# Patient Record
Sex: Female | Born: 1975 | Race: White | Hispanic: No | Marital: Married | Smoking: Never smoker
Health system: Southern US, Community
[De-identification: ages and names within clinical notes are randomized; demographics above are authoritative.]

## PROBLEM LIST (undated history)

## (undated) DIAGNOSIS — F32A Depression, unspecified: Secondary | ICD-10-CM

## (undated) DIAGNOSIS — F419 Anxiety disorder, unspecified: Secondary | ICD-10-CM

## (undated) DIAGNOSIS — F329 Major depressive disorder, single episode, unspecified: Secondary | ICD-10-CM

## (undated) HISTORY — PX: BREAST BIOPSY: SHX20

## (undated) HISTORY — PX: OTHER SURGICAL HISTORY: SHX169

## (undated) HISTORY — PX: WISDOM TOOTH EXTRACTION: SHX21

---

## 2004-04-29 ENCOUNTER — Other Ambulatory Visit: Admission: RE | Admit: 2004-04-29 | Discharge: 2004-04-29 | Payer: Self-pay | Admitting: Family Medicine

## 2005-05-25 ENCOUNTER — Other Ambulatory Visit: Admission: RE | Admit: 2005-05-25 | Discharge: 2005-05-25 | Payer: Self-pay | Admitting: Family Medicine

## 2006-06-24 ENCOUNTER — Other Ambulatory Visit: Admission: RE | Admit: 2006-06-24 | Discharge: 2006-06-24 | Payer: Self-pay | Admitting: Family Medicine

## 2007-06-17 ENCOUNTER — Other Ambulatory Visit: Admission: RE | Admit: 2007-06-17 | Discharge: 2007-06-17 | Payer: Self-pay | Admitting: Family Medicine

## 2007-07-01 ENCOUNTER — Other Ambulatory Visit: Admission: RE | Admit: 2007-07-01 | Discharge: 2007-07-01 | Payer: Self-pay | Admitting: Otolaryngology

## 2008-06-18 ENCOUNTER — Other Ambulatory Visit: Admission: RE | Admit: 2008-06-18 | Discharge: 2008-06-18 | Payer: Self-pay | Admitting: Family Medicine

## 2009-02-07 ENCOUNTER — Inpatient Hospital Stay (HOSPITAL_COMMUNITY): Admission: AD | Admit: 2009-02-07 | Discharge: 2009-02-07 | Payer: Self-pay | Admitting: Obstetrics and Gynecology

## 2010-02-07 ENCOUNTER — Inpatient Hospital Stay (HOSPITAL_COMMUNITY): Admission: AD | Admit: 2010-02-07 | Discharge: 2010-02-14 | Payer: Self-pay | Admitting: Obstetrics and Gynecology

## 2010-02-10 ENCOUNTER — Encounter (INDEPENDENT_AMBULATORY_CARE_PROVIDER_SITE_OTHER): Payer: Self-pay | Admitting: Obstetrics and Gynecology

## 2010-12-07 ENCOUNTER — Encounter: Payer: Self-pay | Admitting: Obstetrics and Gynecology

## 2011-02-09 LAB — COMPREHENSIVE METABOLIC PANEL
ALT: 185 U/L — ABNORMAL HIGH (ref 0–35)
ALT: 35 U/L (ref 0–35)
AST: 171 U/L — ABNORMAL HIGH (ref 0–37)
AST: 41 U/L — ABNORMAL HIGH (ref 0–37)
AST: 42 U/L — ABNORMAL HIGH (ref 0–37)
Albumin: 2 g/dL — ABNORMAL LOW (ref 3.5–5.2)
Albumin: 2.4 g/dL — ABNORMAL LOW (ref 3.5–5.2)
Albumin: 2.8 g/dL — ABNORMAL LOW (ref 3.5–5.2)
Alkaline Phosphatase: 102 U/L (ref 39–117)
Alkaline Phosphatase: 96 U/L (ref 39–117)
Alkaline Phosphatase: 99 U/L (ref 39–117)
BUN: 10 mg/dL (ref 6–23)
BUN: 10 mg/dL (ref 6–23)
BUN: 11 mg/dL (ref 6–23)
BUN: 11 mg/dL (ref 6–23)
BUN: 13 mg/dL (ref 6–23)
CO2: 28 mEq/L (ref 19–32)
Calcium: 6.6 mg/dL — ABNORMAL LOW (ref 8.4–10.5)
Calcium: 6.7 mg/dL — ABNORMAL LOW (ref 8.4–10.5)
Calcium: 7.7 mg/dL — ABNORMAL LOW (ref 8.4–10.5)
Calcium: 9.4 mg/dL (ref 8.4–10.5)
Chloride: 100 mEq/L (ref 96–112)
Chloride: 105 mEq/L (ref 96–112)
Chloride: 105 mEq/L (ref 96–112)
Chloride: 106 mEq/L (ref 96–112)
Creatinine, Ser: 0.71 mg/dL (ref 0.4–1.2)
Creatinine, Ser: 0.78 mg/dL (ref 0.4–1.2)
Creatinine, Ser: 0.83 mg/dL (ref 0.4–1.2)
Creatinine, Ser: 0.84 mg/dL (ref 0.4–1.2)
GFR calc Af Amer: >60 mL/min (ref >60)
GFR calc non Af Amer: >60 mL/min (ref >60)
GFR calc non Af Amer: >60 mL/min (ref >60)
GFR calc non Af Amer: >60 mL/min (ref >60)
GFR calc non Af Amer: >60 mL/min (ref >60)
Glucose, Bld: 128 mg/dL — ABNORMAL HIGH (ref 70–99)
Glucose, Bld: 128 mg/dL — ABNORMAL HIGH (ref 70–99)
Glucose, Bld: 85 mg/dL (ref 70–99)
Glucose, Bld: 91 mg/dL (ref 70–99)
Glucose, Bld: 98 mg/dL (ref 70–99)
Potassium: 4 mEq/L (ref 3.5–5.1)
Potassium: 4.1 mEq/L (ref 3.5–5.1)
Sodium: 132 mEq/L — ABNORMAL LOW (ref 135–145)
Sodium: 135 mEq/L (ref 135–145)
Sodium: 136 mEq/L (ref 135–145)
Sodium: 136 mEq/L (ref 135–145)
Total Bilirubin: 0.3 mg/dL (ref 0.3–1.2)
Total Bilirubin: 0.4 mg/dL (ref 0.3–1.2)
Total Bilirubin: 0.5 mg/dL (ref 0.3–1.2)
Total Bilirubin: 0.6 mg/dL (ref 0.3–1.2)
Total Protein: 4.6 g/dL — ABNORMAL LOW (ref 6.0–8.3)
Total Protein: 4.9 g/dL — ABNORMAL LOW (ref 6.0–8.3)

## 2011-02-09 LAB — CBC
HCT: 31.3 % — ABNORMAL LOW (ref 36.0–46.0)
HCT: 31.4 % — ABNORMAL LOW (ref 36.0–46.0)
HCT: 34.7 % — ABNORMAL LOW (ref 36.0–46.0)
Hemoglobin: 10.4 g/dL — ABNORMAL LOW (ref 12.0–15.0)
Hemoglobin: 10.6 g/dL — ABNORMAL LOW (ref 12.0–15.0)
Hemoglobin: 10.6 g/dL — ABNORMAL LOW (ref 12.0–15.0)
MCHC: 33.7 g/dL (ref 30.0–36.0)
MCHC: 33.8 g/dL (ref 30.0–36.0)
MCHC: 34.1 g/dL (ref 30.0–36.0)
MCV: 95.5 fL (ref 78.0–100.0)
MCV: 96.6 fL (ref 78.0–100.0)
MCV: 96.6 fL (ref 78.0–100.0)
Platelets: 105 10*3/uL — ABNORMAL LOW (ref 150–400)
Platelets: 250 10*3/uL (ref 150–400)
RBC: 3.24 MIL/uL — ABNORMAL LOW (ref 3.87–5.11)
RBC: 3.28 MIL/uL — ABNORMAL LOW (ref 3.87–5.11)
RBC: 3.7 MIL/uL — ABNORMAL LOW (ref 3.87–5.11)
RDW: 13.4 % (ref 11.5–15.5)
WBC: 13.5 10*3/uL — ABNORMAL HIGH (ref 4.0–10.5)
WBC: 16.3 10*3/uL — ABNORMAL HIGH (ref 4.0–10.5)
WBC: 17.5 10*3/uL — ABNORMAL HIGH (ref 4.0–10.5)
WBC: 18.3 10*3/uL — ABNORMAL HIGH (ref 4.0–10.5)
WBC: 20.5 10*3/uL — ABNORMAL HIGH (ref 4.0–10.5)

## 2011-02-09 LAB — URIC ACID
Uric Acid, Serum: 4.8 mg/dL (ref 2.4–7.0)
Uric Acid, Serum: 5.3 mg/dL (ref 2.4–7.0)
Uric Acid, Serum: 6 mg/dL (ref 2.4–7.0)

## 2011-02-09 LAB — RPR: RPR Ser Ql: NONREACTIVE

## 2011-02-26 LAB — CBC
MCHC: 33.9 g/dL (ref 30.0–36.0)
RBC: 3.82 MIL/uL — ABNORMAL LOW (ref 3.87–5.11)
WBC: 10.5 10*3/uL (ref 4.0–10.5)

## 2011-02-26 LAB — HCG, QUANTITATIVE, PREGNANCY: hCG, Beta Chain, Quant, S: 2535 m[IU]/mL — ABNORMAL HIGH (ref <5)

## 2011-02-26 LAB — ABO/RH: ABO/RH(D): O POS

## 2011-09-28 ENCOUNTER — Other Ambulatory Visit: Payer: Self-pay | Admitting: Gynecology

## 2011-11-30 ENCOUNTER — Other Ambulatory Visit: Payer: Self-pay | Admitting: Gynecology

## 2012-10-03 ENCOUNTER — Encounter (HOSPITAL_COMMUNITY): Payer: Self-pay | Admitting: Pharmacist

## 2012-10-10 ENCOUNTER — Encounter (HOSPITAL_COMMUNITY): Payer: Self-pay

## 2012-10-11 ENCOUNTER — Encounter (HOSPITAL_COMMUNITY): Payer: Self-pay

## 2012-10-11 ENCOUNTER — Encounter (HOSPITAL_COMMUNITY)
Admission: RE | Admit: 2012-10-11 | Discharge: 2012-10-11 | Disposition: A | Discharge status: Home / Self Care | Payer: BC Managed Care – PPO | Source: Ambulatory Visit | Attending: Obstetrics and Gynecology | Admitting: Obstetrics and Gynecology

## 2012-10-11 HISTORY — DX: Anxiety disorder, unspecified: F41.9

## 2012-10-11 HISTORY — DX: Major depressive disorder, single episode, unspecified: F32.9

## 2012-10-11 HISTORY — DX: Depression, unspecified: F32.A

## 2012-10-11 LAB — SURGICAL PCR SCREEN
MRSA, PCR: NEGATIVE
Staphylococcus aureus: NEGATIVE

## 2012-10-11 LAB — CBC
MCV: 89.8 fL (ref 78.0–100.0)
Platelets: 141 10*3/uL — ABNORMAL LOW (ref 150–400)
RDW: 13.3 % (ref 11.5–15.5)
WBC: 11.3 10*3/uL — ABNORMAL HIGH (ref 4.0–10.5)

## 2012-10-11 LAB — TYPE AND SCREEN: ABO/RH(D): O POS

## 2012-10-11 NOTE — Patient Instructions (Addendum)
   Your procedure is scheduled on: Monday, Dec 2  Enter through the [employer] [facility] at: 745am Pick up the phone at the desk and dial [phone] and inform us of your arrival.  Please call this number if you have any problems the morning of surgery: [phone]  Remember: Do not eat food after midnight: Sunday Do not drink clear liquids after: Sunday Take these medicines the morning of surgery with a SIP OF WATER: zoloft  Do not wear jewelry, make-up, or FINGER nail polish No metal in your hair or on your body. Do not wear lotions, powders, perfumes. You may wear deodorant.  Please use your CHG wash as directed prior to surgery.  Do not shave anywhere for at least 12 hours prior to first CHG shower.  Do not bring valuables to the hospital. Contacts, dentures or bridgework may not be worn into surgery.  Leave suitcase in the car. After Surgery it may be brought to your room. For patients being admitted to the hospital, checkout time is 11:00am the day of discharge.  Home with husband Caryn Bee .

## 2012-10-16 NOTE — H&P (Signed)
Kathryn Rowland is a 36 y.o. 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  This pregnancy has been uneventful.  The patient does have a h/o anxiety and depression which are stable on zoloft.  She desires permanent sterility with her c-section.  Maternal Medical History:  Fetal activity: Perceived fetal activity is normal.      OB History    Grav Para Term Preterm Abortions TAB SAB Ect Mult Living   3 1  1 1  1   1     2011 C-section  Mid fundal transverse Female 30 weeks 2#14oz SAB x 1  Past Medical History  Diagnosis Date  . Depression   . Anxiety    Past Surgical History  Procedure Date  . Cesarean section 01/2010    30 wks  . Wisdom tooth extraction   . Cyst removed     right side of neck  . Index finger surgery     as an infant - right index   Family History: family history is not on file. Social History:  reports that she has never smoked. She has never used smokeless tobacco. She reports that she does not drink alcohol or use illicit drugs.   Prenatal Transfer Tool  Maternal Diabetes: No Genetic Screening: Normal Maternal Ultrasounds/Referrals: Normal Fetal Ultrasounds or other Referrals:  None Maternal Substance Abuse:  No Significant Maternal Medications:  Meds include: Zoloft Significant Maternal Lab Results:  None Other Comments:  None  ROS    Height 5\' 4"  (1.626 m), weight 85.276 kg (188 lb), last menstrual period 01/15/2012. Maternal Exam:  Uterine Assessment: Contraction strength is mild.  Contraction frequency is rare.   Abdomen: Patient reports no abdominal tenderness. Fetal presentation: vertex  Introitus: Normal vulva. Normal vagina.    Physical Exam  Constitutional: She is oriented to person, place, and time. She appears well-developed and well-nourished.  Cardiovascular: Normal rate and  regular rhythm.   Respiratory: Effort normal and breath sounds normal.  GI: Soft.  Genitourinary: Vagina normal.  Neurological: She is alert and oriented to person, place, and time.  Psychiatric: She has a normal mood and affect.    Prenatal labs: ABO, Rh: --/--/O POS (11/26 1541) Antibody: NEG (11/26 1541) Rubella:  Immune RPR: NON REACTIVE (11/26 1541)  HBsAg:   Neg HIV:   Neg GBS:   Neg First trimester screen and AFP WNL One hour GCT 176 3 hour GTT WNL CF Screen neg  Assessment/Plan: Pt counseled for repeat C-section and TOL not recommended due to h/o higher transverse fundal incision.  Risks of bleeding , infection and possible damage to bowel and bladder d/w her.  She also expresses a wish for permanent sterility.  We discussed BTL in detail including 1/100 risk of failure in a traditional tubal.  We also discussed recent literature suggesting that distal salpingectomy may reduce the risk of ovarian cancer.  She would like to proceed with a full distal salpingectomy.    Oliver Pila 10/16/2012, 10:16 PM

## 2012-10-17 ENCOUNTER — Encounter (HOSPITAL_COMMUNITY): Payer: Self-pay | Admitting: Anesthesiology

## 2012-10-17 ENCOUNTER — Inpatient Hospital Stay (HOSPITAL_COMMUNITY)
Admission: AD | Admit: 2012-10-17 | Payer: BC Managed Care – PPO | Source: Ambulatory Visit | Admitting: Obstetrics and Gynecology

## 2012-10-17 ENCOUNTER — Encounter (HOSPITAL_COMMUNITY)
Admission: RE | Disposition: A | Discharge status: Home / Self Care | Payer: Self-pay | Source: Ambulatory Visit | Attending: Obstetrics and Gynecology

## 2012-10-17 ENCOUNTER — Inpatient Hospital Stay (HOSPITAL_COMMUNITY): Payer: BC Managed Care – PPO | Admitting: Anesthesiology

## 2012-10-17 ENCOUNTER — Encounter (HOSPITAL_COMMUNITY): Payer: Self-pay | Admitting: *Deleted

## 2012-10-17 ENCOUNTER — Inpatient Hospital Stay (HOSPITAL_COMMUNITY)
Admission: RE | Admit: 2012-10-17 | Discharge: 2012-10-19 | DRG: 371 | Disposition: A | Discharge status: Home / Self Care | Payer: BC Managed Care – PPO | Source: Ambulatory Visit | Attending: Obstetrics and Gynecology | Admitting: Obstetrics and Gynecology

## 2012-10-17 DIAGNOSIS — O09529 Supervision of elderly multigravida, unspecified trimester: Secondary | ICD-10-CM | POA: Diagnosis present

## 2012-10-17 DIAGNOSIS — Z98891 History of uterine scar from previous surgery: Secondary | ICD-10-CM

## 2012-10-17 DIAGNOSIS — Z302 Encounter for sterilization: Secondary | ICD-10-CM

## 2012-10-17 DIAGNOSIS — O34219 Maternal care for unspecified type scar from previous cesarean delivery: Principal | ICD-10-CM | POA: Diagnosis present

## 2012-10-17 LAB — TYPE AND SCREEN

## 2012-10-17 SURGERY — Surgical Case
Anesthesia: Spinal | Site: Abdomen | Laterality: Bilateral | Wound class: Clean Contaminated

## 2012-10-17 MED ORDER — SERTRALINE HCL 100 MG PO TABS
100.0000 mg | ORAL_TABLET | Freq: Every day | ORAL | Status: DC
  Administered 2012-10-18: 100 mg via ORAL
  Filled 2012-10-17 (×4): qty 1

## 2012-10-17 MED ORDER — NALOXONE HCL 0.4 MG/ML IJ SOLN: 0.4000 mg | INTRAMUSCULAR | Status: DC | PRN

## 2012-10-17 MED ORDER — KETOROLAC TROMETHAMINE 30 MG/ML IJ SOLN: 30.0000 mg | Freq: Four times a day (QID) | INTRAMUSCULAR | Status: AC | PRN

## 2012-10-17 MED ORDER — IBUPROFEN 600 MG PO TABS
600.0000 mg | ORAL_TABLET | Freq: Four times a day (QID) | ORAL | Status: DC | PRN
  Filled 2012-10-17 (×2): qty 1

## 2012-10-17 MED ORDER — PHENYLEPHRINE 40 MCG/ML (10ML) SYRINGE FOR IV PUSH (FOR BLOOD PRESSURE SUPPORT)
PREFILLED_SYRINGE | INTRAVENOUS | Status: AC
  Filled 2012-10-17: qty 5

## 2012-10-17 MED ORDER — OXYTOCIN 10 UNIT/ML IJ SOLN
INTRAMUSCULAR | Status: AC
  Filled 2012-10-17: qty 4

## 2012-10-17 MED ORDER — METOCLOPRAMIDE HCL 5 MG/ML IJ SOLN: 10.0000 mg | Freq: Three times a day (TID) | INTRAMUSCULAR | Status: DC | PRN

## 2012-10-17 MED ORDER — NALOXONE HCL 1 MG/ML IJ SOLN
1.0000 ug/kg/h | INTRAVENOUS | Status: DC | PRN
  Filled 2012-10-17: qty 2

## 2012-10-17 MED ORDER — DIPHENHYDRAMINE HCL 50 MG/ML IJ SOLN
12.5000 mg | INTRAMUSCULAR | Status: DC | PRN
  Administered 2012-10-17: 14:00:00 via INTRAVENOUS
  Filled 2012-10-17: qty 1

## 2012-10-17 MED ORDER — OXYCODONE-ACETAMINOPHEN 5-325 MG PO TABS
1.0000 | ORAL_TABLET | ORAL | Status: DC | PRN
  Administered 2012-10-18 (×3): 1 via ORAL
  Filled 2012-10-17 (×3): qty 1

## 2012-10-17 MED ORDER — KETOROLAC TROMETHAMINE 60 MG/2ML IM SOLN
60.0000 mg | Freq: Once | INTRAMUSCULAR | Status: AC | PRN
  Administered 2012-10-17: 60 mg via INTRAMUSCULAR

## 2012-10-17 MED ORDER — EPHEDRINE SULFATE 50 MG/ML IJ SOLN
INTRAMUSCULAR | Status: DC | PRN
  Administered 2012-10-17: 5 mg via INTRAVENOUS

## 2012-10-17 MED ORDER — EPHEDRINE 5 MG/ML INJ
INTRAVENOUS | Status: AC
  Filled 2012-10-17: qty 10

## 2012-10-17 MED ORDER — ONDANSETRON HCL 4 MG PO TABS: 4.0000 mg | ORAL_TABLET | ORAL | Status: DC | PRN

## 2012-10-17 MED ORDER — LANOLIN HYDROUS EX OINT: 1.0000 "application " | TOPICAL_OINTMENT | CUTANEOUS | Status: DC | PRN

## 2012-10-17 MED ORDER — FENTANYL CITRATE 0.05 MG/ML IJ SOLN
INTRAMUSCULAR | Status: AC
  Filled 2012-10-17: qty 2

## 2012-10-17 MED ORDER — LACTATED RINGERS IV SOLN
INTRAVENOUS | Status: DC
  Administered 2012-10-17 (×3): via INTRAVENOUS

## 2012-10-17 MED ORDER — FENTANYL CITRATE 0.05 MG/ML IJ SOLN
INTRAMUSCULAR | Status: DC | PRN
  Administered 2012-10-17: 25 ug via INTRATHECAL

## 2012-10-17 MED ORDER — CEFAZOLIN SODIUM-DEXTROSE 2-3 GM-% IV SOLR
INTRAVENOUS | Status: AC
  Filled 2012-10-17: qty 50

## 2012-10-17 MED ORDER — SIMETHICONE 80 MG PO CHEW
80.0000 mg | CHEWABLE_TABLET | Freq: Three times a day (TID) | ORAL | Status: DC
  Administered 2012-10-17 – 2012-10-18 (×6): 80 mg via ORAL

## 2012-10-17 MED ORDER — OXYTOCIN 10 UNIT/ML IJ SOLN
INTRAMUSCULAR | Status: DC | PRN
  Administered 2012-10-17: 40 [IU] via INTRAMUSCULAR

## 2012-10-17 MED ORDER — LACTATED RINGERS IV SOLN
INTRAVENOUS | Status: DC
Start: 2012-10-17 — End: 2012-10-17
  Administered 2012-10-17: 08:00:00 via INTRAVENOUS

## 2012-10-17 MED ORDER — ONDANSETRON HCL 4 MG/2ML IJ SOLN
INTRAMUSCULAR | Status: AC
  Filled 2012-10-17: qty 2

## 2012-10-17 MED ORDER — NALBUPHINE HCL 10 MG/ML IJ SOLN
5.0000 mg | INTRAMUSCULAR | Status: DC | PRN
  Filled 2012-10-17 (×2): qty 1

## 2012-10-17 MED ORDER — OXYTOCIN 40 UNITS IN LACTATED RINGERS INFUSION - SIMPLE MED: 62.5000 mL/h | INTRAVENOUS | Status: AC

## 2012-10-17 MED ORDER — ZOLPIDEM TARTRATE 5 MG PO TABS: 5.0000 mg | ORAL_TABLET | Freq: Every evening | ORAL | Status: DC | PRN

## 2012-10-17 MED ORDER — SCOPOLAMINE 1 MG/3DAYS TD PT72
MEDICATED_PATCH | TRANSDERMAL | Status: AC
  Administered 2012-10-17: 1.5 mg via TRANSDERMAL
  Filled 2012-10-17: qty 1

## 2012-10-17 MED ORDER — KETOROLAC TROMETHAMINE 60 MG/2ML IM SOLN
INTRAMUSCULAR | Status: AC
  Filled 2012-10-17: qty 2

## 2012-10-17 MED ORDER — SCOPOLAMINE 1 MG/3DAYS TD PT72
1.0000 | MEDICATED_PATCH | Freq: Once | TRANSDERMAL | Status: DC
  Administered 2012-10-17: 1.5 mg via TRANSDERMAL

## 2012-10-17 MED ORDER — WITCH HAZEL-GLYCERIN EX PADS: 1.0000 "application " | MEDICATED_PAD | CUTANEOUS | Status: DC | PRN

## 2012-10-17 MED ORDER — CEFAZOLIN SODIUM-DEXTROSE 2-3 GM-% IV SOLR
2.0000 g | INTRAVENOUS | Status: AC
  Administered 2012-10-17: 2 g via INTRAVENOUS

## 2012-10-17 MED ORDER — SENNOSIDES-DOCUSATE SODIUM 8.6-50 MG PO TABS
2.0000 | ORAL_TABLET | Freq: Every day | ORAL | Status: DC
  Administered 2012-10-17 – 2012-10-18 (×2): 2 via ORAL

## 2012-10-17 MED ORDER — SODIUM CHLORIDE 0.9 % IJ SOLN: 3.0000 mL | INTRAMUSCULAR | Status: DC | PRN

## 2012-10-17 MED ORDER — DIPHENHYDRAMINE HCL 25 MG PO CAPS
25.0000 mg | ORAL_CAPSULE | ORAL | Status: DC | PRN
  Filled 2012-10-17: qty 1

## 2012-10-17 MED ORDER — NALBUPHINE HCL 10 MG/ML IJ SOLN
5.0000 mg | INTRAMUSCULAR | Status: DC | PRN
  Administered 2012-10-17: 10 mg via SUBCUTANEOUS
  Filled 2012-10-17: qty 1

## 2012-10-17 MED ORDER — DIBUCAINE 1 % RE OINT: 1.0000 "application " | TOPICAL_OINTMENT | RECTAL | Status: DC | PRN

## 2012-10-17 MED ORDER — ONDANSETRON HCL 4 MG/2ML IJ SOLN
INTRAMUSCULAR | Status: DC | PRN
  Administered 2012-10-17: 4 mg via INTRAVENOUS

## 2012-10-17 MED ORDER — PHENYLEPHRINE HCL 10 MG/ML IJ SOLN
INTRAMUSCULAR | Status: DC | PRN
  Administered 2012-10-17 (×5): 40 ug via INTRAVENOUS

## 2012-10-17 MED ORDER — PRENATAL MULTIVITAMIN CH
1.0000 | ORAL_TABLET | Freq: Every day | ORAL | Status: DC
  Administered 2012-10-18 – 2012-10-19 (×2): 1 via ORAL
  Filled 2012-10-17 (×2): qty 1

## 2012-10-17 MED ORDER — MEPERIDINE HCL 25 MG/ML IJ SOLN: 6.2500 mg | INTRAMUSCULAR | Status: DC | PRN

## 2012-10-17 MED ORDER — IBUPROFEN 600 MG PO TABS
600.0000 mg | ORAL_TABLET | Freq: Four times a day (QID) | ORAL | Status: DC
  Administered 2012-10-17 – 2012-10-19 (×8): 600 mg via ORAL
  Filled 2012-10-17 (×5): qty 1

## 2012-10-17 MED ORDER — ONDANSETRON HCL 4 MG/2ML IJ SOLN: 4.0000 mg | Freq: Three times a day (TID) | INTRAMUSCULAR | Status: DC | PRN

## 2012-10-17 MED ORDER — LACTATED RINGERS IV SOLN
INTRAVENOUS | Status: DC | PRN
  Administered 2012-10-17: 10:00:00 via INTRAVENOUS

## 2012-10-17 MED ORDER — DIPHENHYDRAMINE HCL 25 MG PO CAPS: 25.0000 mg | ORAL_CAPSULE | Freq: Four times a day (QID) | ORAL | Status: DC | PRN

## 2012-10-17 MED ORDER — TETANUS-DIPHTH-ACELL PERTUSSIS 5-2.5-18.5 LF-MCG/0.5 IM SUSP: 0.5000 mL | Freq: Once | INTRAMUSCULAR | Status: DC

## 2012-10-17 MED ORDER — DIPHENHYDRAMINE HCL 50 MG/ML IJ SOLN: 25.0000 mg | INTRAMUSCULAR | Status: DC | PRN

## 2012-10-17 MED ORDER — HYDROMORPHONE HCL PF 1 MG/ML IJ SOLN: 0.2500 mg | INTRAMUSCULAR | Status: DC | PRN

## 2012-10-17 MED ORDER — SCOPOLAMINE 1 MG/3DAYS TD PT72: 1.0000 | MEDICATED_PATCH | Freq: Once | TRANSDERMAL | Status: DC

## 2012-10-17 MED ORDER — MORPHINE SULFATE (PF) 0.5 MG/ML IJ SOLN
INTRAMUSCULAR | Status: DC | PRN
  Administered 2012-10-17: .15 mg via EPIDURAL

## 2012-10-17 MED ORDER — LACTATED RINGERS IV SOLN
INTRAVENOUS | Status: DC
  Administered 2012-10-17: 18:00:00 via INTRAVENOUS

## 2012-10-17 MED ORDER — MENTHOL 3 MG MT LOZG: 1.0000 | LOZENGE | OROMUCOSAL | Status: DC | PRN

## 2012-10-17 MED ORDER — ONDANSETRON HCL 4 MG/2ML IJ SOLN: 4.0000 mg | INTRAMUSCULAR | Status: DC | PRN

## 2012-10-17 MED ORDER — SIMETHICONE 80 MG PO CHEW: 80.0000 mg | CHEWABLE_TABLET | ORAL | Status: DC | PRN

## 2012-10-17 MED ORDER — MORPHINE SULFATE 0.5 MG/ML IJ SOLN
INTRAMUSCULAR | Status: AC
  Filled 2012-10-17: qty 10

## 2012-10-17 SURGICAL SUPPLY — 28 items
APL SKNCLS STERI-STRIP NONHPOA (GAUZE/BANDAGES/DRESSINGS) ×2
BENZOIN TINCTURE PRP APPL 2/3 (GAUZE/BANDAGES/DRESSINGS) ×4 IMPLANT
CANISTER SUCTION 2500CC (MISCELLANEOUS) ×2 IMPLANT
CLOTH BEACON ORANGE TIMEOUT ST (SAFETY) ×2 IMPLANT
CONTAINER PREFILL 10% NBF 15ML (MISCELLANEOUS) ×4 IMPLANT
DRSG COVADERM 4X10 (GAUZE/BANDAGES/DRESSINGS) ×2 IMPLANT
DURAPREP 26ML APPLICATOR (WOUND CARE) ×2 IMPLANT
ELECT REM PT RETURN 9FT ADLT (ELECTROSURGICAL) ×2
ELECTRODE REM PT RTRN 9FT ADLT (ELECTROSURGICAL) ×1 IMPLANT
GLOVE BIO SURGEON STRL SZ 6.5 (GLOVE) ×4 IMPLANT
GOWN PREVENTION PLUS LG XLONG (DISPOSABLE) ×4 IMPLANT
NS IRRIG 1000ML POUR BTL (IV SOLUTION) ×2 IMPLANT
PACK C SECTION WH (CUSTOM PROCEDURE TRAY) ×2 IMPLANT
PAD OB MATERNITY 4.3X12.25 (PERSONAL CARE ITEMS) ×2 IMPLANT
RTRCTR C-SECT PINK 25CM LRG (MISCELLANEOUS) ×2 IMPLANT
STRIP CLOSURE SKIN 1/2X4 (GAUZE/BANDAGES/DRESSINGS) ×4 IMPLANT
SUT CHROMIC 1 CTX 36 (SUTURE) ×6 IMPLANT
SUT PLAIN 0 NONE (SUTURE) ×2 IMPLANT
SUT PLAIN 2 0 (SUTURE) ×2
SUT PLAIN ABS 2-0 CT1 27XMFL (SUTURE) ×1 IMPLANT
SUT VIC AB 0 CT1 27 (SUTURE) ×4
SUT VIC AB 0 CT1 27XBRD ANBCTR (SUTURE) ×2 IMPLANT
SUT VIC AB 2-0 CT1 27 (SUTURE) ×4
SUT VIC AB 2-0 CT1 TAPERPNT 27 (SUTURE) ×2 IMPLANT
SUT VIC AB 4-0 KS 27 (SUTURE) ×2 IMPLANT
TOWEL OR 17X24 6PK STRL BLUE (TOWEL DISPOSABLE) ×4 IMPLANT
TRAY FOLEY CATH 14FR (SET/KITS/TRAYS/PACK) ×2 IMPLANT
WATER STERILE IRR 1000ML POUR (IV SOLUTION) ×2 IMPLANT

## 2012-10-17 NOTE — Anesthesia Preprocedure Evaluation (Signed)

## 2012-10-17 NOTE — Anesthesia Procedure Notes (Signed)

## 2012-10-17 NOTE — Op Note (Signed)
Operative note  Preoperative diagnosis Term pregnancy at 39+ weeks gestation Previous high fundal transverse cesarean section not a candidate for trial of labor Desires permanent sterility  Postoperative diagnosis Same  Procedure Repeat low transverse cesarean section with distal salpingectomy bilaterally  Surgeon Dr. Huel Cote Dr. Tracey Harries  Anesthesia Spinal  Fluids Estimated blood loss 800 cc Urine output 200 cc clear urine IV fluids 2200 cc LR  Findings There is a viable female infant in the vertex presentation. Apgars were 8 and 9. Weight pending at time of dictation. Patient had normal ovaries noted bilaterally. The right fallopian tube was adherent to the anterior fundus at its distal end and this was taken down and removed for the distal salpingectomy. The left fallopian tube was within normal limits. The uterus was normal except for a scar noted where the prior C-section had occurred.  Specimen Placenta to L&D Distal fallopian tube segments to pathology  Procedure note After informed consent was obtained patient was taken to the operating room and spinal anesthesia obtained without difficulty with Dr. Cristela Blue.  An appropriate time out was performed and the patient was prepped and draped in the dorsal supine position with a leftward tilt. After adequate anesthesia was confirmed an incision was made through pre-existing Pfannenstiel scar with the scalpel and carried through to underlying layer of fascia by sharp dissection and Bovie cautery. The fascia was then nicked in the midline and the incision was extended laterally with Mayo scissors the inferior aspect was grasped with Coker clamps elevated and dissected off the underlying rectus muscles. In a similar fashion the superior aspect was elevated and dissected off the rectus muscles. The rectus muscles were separated in the midline and the peritoneal cavity entered sharply. The peritoneal incision was then  extended both superiorly and inferiorly with careful attention to avoid both bowel and bladder. The Alexis self-retaining wound retractor was then placed within the incision and the lower uterine segment exposed. The bladder flap was developed and the lower uterine segment then incised in a transverse fashion. The uterine cavity was entered bluntly and clear fluid noted. The infant was then delivered atraumatically the nose mouth bulb suctioned and the remainder of the body delivered without difficulty. The cord was clamped and cut and  infant handed to the waiting pediatricians. The placenta was then expressed from the uterus and handed off for cord blood donation. The uterus was cleared of all clots and debris with moist lap sponge. The uterine incision was then closed in 2 layers the first a running locked layer of 0 chromic and the second an imbricating layer of the same suture. Just above the current incision once the uterus had contracted down there was an indentation and a weaker spot in the wall of the uterus where her prior scar was noted. On the right side this was quite thin and was reinforced with figure-of-eight suture of 0 chromic. When the incision was hemostatic attention was turned to the fallopian tubes. On the right side the fallopian tube was visible and adherent to the anterior fundus just above the old incision line. This was taken down with Bovie cautery where the distal end was now freely mobile. The entire distal end of the fallopian tube was then clamped and a Kelly clamp and a free tie of 0 plain passed around it. The distal tube was then amputated and an additional suture ligature of 2-0 Vicryl placed on the pedicle with good hemostasis noted. On the left fallopian tube  it would there is no adhesions noted and the tube was freely mobile. It was therefore elevated and cross clamped about halfway down with a Kelly clamp. 0 plain free-tie was also placed around this and in the tube amputated  and additional suture ligature of 2-0 Vicryl was placed and hemostasis observed. The gutters were cleared of all clots and debris and the incision once again inspected as well as the tubal pedicles with no bleeding noted. The Alexis retractor was then removed from the abdomen as well as any other instruments and sponges. The peritoneum and rectus muscles were then reapproximated with several interrupted mattress sutures of 2-0 Vicryl. The fascia was closed with 0 Vicryl in a running fashion. The subcutaneous tissue was closed with 2-0 plain in a running fashion. The skin was closed with 4-0 Vicryl in a subcuticular stitch on a Keith needle. The incision was reinforced with benzoin and Steri-Strips and all instruments and sponge counts were again correct. The patient was taken to the recovery room in good condition with the baby accompanying her.

## 2012-10-17 NOTE — Brief Op Note (Signed)
10/17/2012  10:30 AM  PATIENT:  Kathryn Rowland  36 y.o. female  PRE-OPERATIVE DIAGNOSIS:  Previous Cesarean Section, desires sterility  POST-OPERATIVE DIAGNOSIS:  Previous Cesarean Section, desires sterility  PROCEDURE:  Repeat low transverse c-section with bilateral distal salpingectomy SURGEON:  Surgeon(s) and Role:    * Oliver Pila, MD - Primary      Tracey Harries, MD  ANESTHESIA:   spinal  EBL:  Total I/O In: 3000 [I.V.:3000] Out: 925 [Urine:75; Blood:850]  BLOOD ADMINISTERED:none  DRAINS: Urinary Catheter (Foley)   LOCAL MEDICATIONS USED:  NONE  SPECIMEN:  Placenta and distal fallopian tube segments  DISPOSITION OF SPECIMEN:  PATHOLOGY and L&D  COUNTS:  YES  TOURNIQUET:  * No tourniquets in log *  DICTATION: .Dragon Dictation  PLAN OF CARE: Admit to inpatient   PATIENT DISPOSITION:  PACU - hemodynamically stable.

## 2012-10-17 NOTE — Transfer of Care (Signed)
Immediate Anesthesia Transfer of Care Note  Patient: Kathryn Rowland  Procedure(s) Performed: Procedure(s) (LRB) with comments: CESAREAN SECTION WITH BILATERAL TUBAL LIGATION (Bilateral) - Repeat  Patient Location: PACU  Anesthesia Type:Spinal  Level of Consciousness: awake, alert , oriented and patient cooperative  Airway & Oxygen Therapy: Patient Spontanous Breathing  Post-op Assessment: Report given to PACU RN and Post -op Vital signs reviewed and stable  Post vital signs: Reviewed and stable  Complications: No apparent anesthesia complications

## 2012-10-17 NOTE — Anesthesia Postprocedure Evaluation (Signed)
  Anesthesia Post-op Note  Patient: Kathryn Rowland  Procedure(s) Performed: Procedure(s) (LRB) with comments: CESAREAN SECTION WITH BILATERAL TUBAL LIGATION (Bilateral) - Repeat   Patient is awake, responsive, moving her legs, and has signs of resolution of her numbness. Pain and nausea are reasonably well controlled. Vital signs are stable and clinically acceptable. Oxygen saturation is clinically acceptable. There are no apparent anesthetic complications at this time. Patient is ready for discharge.

## 2012-10-17 NOTE — Progress Notes (Signed)
Patient ID: Kathryn Rowland, female   DOB: [birth year], 36 y.o.   MRN: [id number] Per pt no changes in dictated H&P.  Brief exam WNL, ready to proceed.

## 2012-10-18 ENCOUNTER — Encounter (HOSPITAL_COMMUNITY): Payer: Self-pay | Admitting: Obstetrics and Gynecology

## 2012-10-18 LAB — CBC
Hemoglobin: 11.6 g/dL — ABNORMAL LOW (ref 12.0–15.0)
MCH: 29.8 pg (ref 26.0–34.0)
MCHC: 32.3 g/dL (ref 30.0–36.0)
Platelets: 154 10*3/uL (ref 150–400)
RBC: 3.89 MIL/uL (ref 3.87–5.11)

## 2012-10-18 NOTE — Progress Notes (Signed)

## 2012-10-18 NOTE — Addendum Note (Signed)
Addendum  created 10/18/12 0934 by Earmon Phoenix, CRNA   Modules edited:Notes Section

## 2012-10-18 NOTE — Anesthesia Postprocedure Evaluation (Signed)
  Anesthesia Post-op Note  Patient: Kathryn Rowland  Procedure(s) Performed: Procedure(s) (LRB) with comments: CESAREAN SECTION WITH BILATERAL TUBAL LIGATION (Bilateral) - Repeat  Patient Location: Mother/Baby  Anesthesia Type:Spinal  Level of Consciousness: awake, alert  and oriented  Airway and Oxygen Therapy: Patient Spontanous Breathing  Post-op Pain: mild  Post-op Assessment: Patient's Cardiovascular Status Stable, Respiratory Function Stable, No headache, No backache, No residual numbness and No residual motor weakness  Post-op Vital Signs: stable  Complications: No apparent anesthesia complications

## 2012-10-18 NOTE — Progress Notes (Signed)
Subjective: Postpartum Day 1: Cesarean Delivery Patient reports tolerating PO and no problems voiding.    Objective: Vital signs in last 24 hours: Temp:  [97 F (36.1 C)-98.6 F (37 C)] 97.7 F (36.5 C) (12/03 0530) Pulse Rate:  [59-84] 76  (12/03 0530) Resp:  [16-20] 16  (12/03 0530) BP: (98-147)/(57-88) 118/69 mmHg (12/03 0530) SpO2:  [99 %-100 %] 100 % (12/03 0530)  Physical Exam:  General: alert and cooperative Lochia: appropriate Uterine Fundus: firm Incision: C/D/I    Basename 10/18/12 0555  HGB 11.6*  HCT 35.9*    Assessment/Plan: Status post Cesarean section. Doing well postoperatively.  Continue current care.  Oliver Pila 10/18/2012, 8:53 AM

## 2012-10-19 MED ORDER — IBUPROFEN 600 MG PO TABS: 600.0000 mg | ORAL_TABLET | Freq: Four times a day (QID) | ORAL | Status: DC | PRN

## 2012-10-19 MED ORDER — OXYCODONE-ACETAMINOPHEN 5-325 MG PO TABS: 1.0000 | ORAL_TABLET | ORAL | Status: DC | PRN

## 2012-10-19 NOTE — Discharge Summary (Signed)
Obstetric Discharge Summary Reason for Admission: cesarean section Prenatal Procedures: none Intrapartum Procedures: cesarean: low cervical, transverse and bilateral distal salpingectomy Postpartum Procedures: none Complications-Operative and Postpartum: none Hemoglobin  Date Value Range Status  10/18/2012 11.6* 12.0 - 15.0 g/dL Final     HCT  Date Value Range Status  10/18/2012 35.9* 36.0 - 46.0 % Final    Physical Exam:  General: alert and cooperative Lochia: appropriate Uterine Fundus: soft Incision: healing well, no significant drainage   Discharge Diagnoses: Term Pregnancy-delivered  Discharge Information: Date: 10/19/2012 Activity: pelvic rest Diet: routine Medications: Ibuprofen and Percocet Condition: improved Instructions: refer to practice specific booklet Discharge to: home Follow-up Information    Follow up with Oliver Pila, MD. Schedule an appointment as soon as possible for a visit in 2 weeks. (incision check)    Contact information:   [address], [address] [place] [address] [phone]          Newborn Data: Live born female  Birth Weight: 6 lb 11.8 oz (3055 g) APGAR: 9, 9  Home with mother.  Oliver Pila 10/19/2012, 9:25 AM

## 2012-10-19 NOTE — Progress Notes (Signed)
Subjective: Postpartum Day 2: Cesarean Delivery Patient reports tolerating PO, + flatus and no problems voiding.    Objective: Vital signs in last 24 hours: Temp:  [98 F (36.7 C)-98.4 F (36.9 C)] 98.3 F (36.8 C) (12/04 0542) Pulse Rate:  [80-88] 80  (12/04 0542) Resp:  [12-20] 18  (12/04 0542) BP: (110-134)/(65-78) 122/75 mmHg (12/04 0542) SpO2:  [99 %-100 %] 99 % (12/03 2115)  Physical Exam:  General: alert and cooperative Lochia: appropriate Uterine Fundus: firm Incision: healing well, no significant drainage    Basename 10/18/12 0555  HGB 11.6*  HCT 35.9*    Assessment/Plan: Status post Cesarean section. Doing well postoperatively.  Discharge home with standard precautions and return to office in 2 weeks for incision check.  Kathryn Rowland 10/19/2012, 9:22 AM

## 2012-10-31 ENCOUNTER — Telehealth (HOSPITAL_COMMUNITY): Payer: Self-pay | Admitting: *Deleted

## 2012-10-31 NOTE — Telephone Encounter (Signed)
Resolve episode 

## 2012-12-22 ENCOUNTER — Encounter (HOSPITAL_COMMUNITY)
Admission: RE | Admit: 2012-12-22 | Discharge: 2012-12-22 | Disposition: A | Discharge status: Home / Self Care | Payer: BC Managed Care – PPO | Source: Ambulatory Visit | Attending: Obstetrics and Gynecology | Admitting: Obstetrics and Gynecology

## 2012-12-22 DIAGNOSIS — O923 Agalactia: Secondary | ICD-10-CM | POA: Insufficient documentation

## 2013-01-20 ENCOUNTER — Encounter (HOSPITAL_COMMUNITY)
Admission: RE | Admit: 2013-01-20 | Discharge: 2013-01-20 | Disposition: A | Discharge status: Home / Self Care | Payer: BC Managed Care – PPO | Source: Ambulatory Visit | Attending: Obstetrics and Gynecology | Admitting: Obstetrics and Gynecology

## 2013-01-20 DIAGNOSIS — O923 Agalactia: Secondary | ICD-10-CM | POA: Insufficient documentation

## 2013-02-20 ENCOUNTER — Encounter (HOSPITAL_COMMUNITY)
Admission: RE | Admit: 2013-02-20 | Discharge: 2013-02-20 | Disposition: A | Discharge status: Home / Self Care | Payer: BC Managed Care – PPO | Source: Ambulatory Visit | Attending: Obstetrics and Gynecology | Admitting: Obstetrics and Gynecology

## 2013-02-20 DIAGNOSIS — O923 Agalactia: Secondary | ICD-10-CM | POA: Insufficient documentation

## 2013-03-23 ENCOUNTER — Encounter (HOSPITAL_COMMUNITY)
Admission: RE | Admit: 2013-03-23 | Discharge: 2013-03-23 | Disposition: A | Discharge status: Home / Self Care | Payer: BC Managed Care – PPO | Source: Ambulatory Visit | Attending: Obstetrics and Gynecology | Admitting: Obstetrics and Gynecology

## 2013-03-23 DIAGNOSIS — O923 Agalactia: Secondary | ICD-10-CM | POA: Insufficient documentation

## 2013-04-23 ENCOUNTER — Encounter (HOSPITAL_COMMUNITY)
Admission: RE | Admit: 2013-04-23 | Discharge: 2013-04-23 | Disposition: A | Discharge status: Home / Self Care | Payer: BC Managed Care – PPO | Source: Ambulatory Visit | Attending: Obstetrics and Gynecology | Admitting: Obstetrics and Gynecology

## 2013-04-23 DIAGNOSIS — O923 Agalactia: Secondary | ICD-10-CM | POA: Insufficient documentation

## 2013-05-23 ENCOUNTER — Encounter (HOSPITAL_COMMUNITY)
Admission: RE | Admit: 2013-05-23 | Discharge: 2013-05-23 | Disposition: A | Discharge status: Home / Self Care | Payer: BC Managed Care – PPO | Source: Ambulatory Visit | Attending: Obstetrics and Gynecology | Admitting: Obstetrics and Gynecology

## 2013-05-23 DIAGNOSIS — O923 Agalactia: Secondary | ICD-10-CM | POA: Insufficient documentation

## 2013-05-24 ENCOUNTER — Other Ambulatory Visit: Payer: Self-pay

## 2013-06-23 ENCOUNTER — Encounter (HOSPITAL_COMMUNITY)
Admission: RE | Admit: 2013-06-23 | Discharge: 2013-06-23 | Disposition: A | Discharge status: Home / Self Care | Payer: BC Managed Care – PPO | Source: Ambulatory Visit | Attending: Obstetrics and Gynecology | Admitting: Obstetrics and Gynecology

## 2013-06-23 DIAGNOSIS — O923 Agalactia: Secondary | ICD-10-CM | POA: Insufficient documentation

## 2013-07-24 ENCOUNTER — Encounter (HOSPITAL_COMMUNITY)
Admission: RE | Admit: 2013-07-24 | Discharge: 2013-07-24 | Disposition: A | Discharge status: Home / Self Care | Payer: BC Managed Care – PPO | Source: Ambulatory Visit | Attending: Obstetrics and Gynecology | Admitting: Obstetrics and Gynecology

## 2013-07-24 DIAGNOSIS — O923 Agalactia: Secondary | ICD-10-CM | POA: Insufficient documentation

## 2013-08-24 ENCOUNTER — Encounter (HOSPITAL_COMMUNITY)
Admission: RE | Admit: 2013-08-24 | Discharge: 2013-08-24 | Disposition: A | Discharge status: Home / Self Care | Payer: BC Managed Care – PPO | Source: Ambulatory Visit | Attending: Obstetrics and Gynecology | Admitting: Obstetrics and Gynecology

## 2013-08-24 DIAGNOSIS — O923 Agalactia: Secondary | ICD-10-CM | POA: Insufficient documentation

## 2013-09-24 ENCOUNTER — Encounter (HOSPITAL_COMMUNITY)
Admission: RE | Admit: 2013-09-24 | Discharge: 2013-09-24 | Disposition: A | Discharge status: Home / Self Care | Payer: BC Managed Care – PPO | Source: Ambulatory Visit | Attending: Obstetrics and Gynecology | Admitting: Obstetrics and Gynecology

## 2013-09-24 DIAGNOSIS — O923 Agalactia: Secondary | ICD-10-CM | POA: Insufficient documentation

## 2013-10-25 ENCOUNTER — Encounter (HOSPITAL_COMMUNITY)
Admission: RE | Admit: 2013-10-25 | Discharge: 2013-10-25 | Disposition: A | Discharge status: Home / Self Care | Payer: BC Managed Care – PPO | Source: Ambulatory Visit | Attending: Obstetrics and Gynecology | Admitting: Obstetrics and Gynecology

## 2013-10-25 DIAGNOSIS — O923 Agalactia: Secondary | ICD-10-CM | POA: Insufficient documentation

## 2013-11-25 ENCOUNTER — Encounter (HOSPITAL_COMMUNITY)
Admission: RE | Admit: 2013-11-25 | Discharge: 2013-11-25 | Disposition: A | Discharge status: Home / Self Care | Payer: BC Managed Care – PPO | Source: Ambulatory Visit | Attending: Obstetrics and Gynecology | Admitting: Obstetrics and Gynecology

## 2013-11-25 DIAGNOSIS — O923 Agalactia: Secondary | ICD-10-CM | POA: Insufficient documentation

## 2013-12-26 ENCOUNTER — Encounter (HOSPITAL_COMMUNITY)
Admission: RE | Admit: 2013-12-26 | Discharge: 2013-12-26 | Disposition: A | Discharge status: Home / Self Care | Payer: BC Managed Care – PPO | Source: Ambulatory Visit | Attending: Obstetrics and Gynecology | Admitting: Obstetrics and Gynecology

## 2013-12-26 DIAGNOSIS — O923 Agalactia: Secondary | ICD-10-CM | POA: Insufficient documentation

## 2014-01-24 ENCOUNTER — Encounter (HOSPITAL_COMMUNITY)
Admission: RE | Admit: 2014-01-24 | Discharge: 2014-01-24 | Disposition: A | Discharge status: Home / Self Care | Payer: BC Managed Care – PPO | Source: Ambulatory Visit | Attending: Obstetrics and Gynecology | Admitting: Obstetrics and Gynecology

## 2014-01-24 DIAGNOSIS — O923 Agalactia: Secondary | ICD-10-CM | POA: Insufficient documentation

## 2014-02-25 ENCOUNTER — Encounter (HOSPITAL_COMMUNITY)
Admission: RE | Admit: 2014-02-25 | Discharge: 2014-02-25 | Disposition: A | Discharge status: Home / Self Care | Payer: BC Managed Care – PPO | Source: Ambulatory Visit | Attending: Obstetrics and Gynecology | Admitting: Obstetrics and Gynecology

## 2014-02-25 DIAGNOSIS — O923 Agalactia: Secondary | ICD-10-CM | POA: Insufficient documentation

## 2014-03-27 ENCOUNTER — Encounter (HOSPITAL_COMMUNITY)
Admission: RE | Admit: 2014-03-27 | Discharge: 2014-03-27 | Disposition: A | Discharge status: Home / Self Care | Payer: BC Managed Care – PPO | Source: Ambulatory Visit | Attending: Obstetrics and Gynecology | Admitting: Obstetrics and Gynecology

## 2014-03-27 DIAGNOSIS — O923 Agalactia: Secondary | ICD-10-CM | POA: Insufficient documentation

## 2014-04-27 ENCOUNTER — Encounter (HOSPITAL_COMMUNITY)
Admission: RE | Admit: 2014-04-27 | Discharge: 2014-04-27 | Disposition: A | Discharge status: Home / Self Care | Payer: BC Managed Care – PPO | Source: Ambulatory Visit | Attending: Obstetrics and Gynecology | Admitting: Obstetrics and Gynecology

## 2014-04-27 DIAGNOSIS — O923 Agalactia: Secondary | ICD-10-CM | POA: Insufficient documentation

## 2014-05-09 ENCOUNTER — Other Ambulatory Visit (HOSPITAL_COMMUNITY)
Admission: RE | Admit: 2014-05-09 | Discharge: 2014-05-09 | Disposition: A | Discharge status: Home / Self Care | Payer: BC Managed Care – PPO | Source: Ambulatory Visit | Attending: Family Medicine | Admitting: Family Medicine

## 2014-05-09 ENCOUNTER — Other Ambulatory Visit: Payer: Self-pay | Admitting: Physician Assistant

## 2014-05-09 DIAGNOSIS — Z124 Encounter for screening for malignant neoplasm of cervix: Secondary | ICD-10-CM | POA: Insufficient documentation

## 2014-05-11 LAB — CYTOLOGY - PAP

## 2014-05-27 ENCOUNTER — Encounter (HOSPITAL_COMMUNITY)
Admission: RE | Admit: 2014-05-27 | Discharge: 2014-05-27 | Disposition: A | Discharge status: Home / Self Care | Payer: BC Managed Care – PPO | Source: Ambulatory Visit | Attending: Obstetrics and Gynecology | Admitting: Obstetrics and Gynecology

## 2014-05-27 DIAGNOSIS — O923 Agalactia: Secondary | ICD-10-CM | POA: Insufficient documentation

## 2014-09-17 ENCOUNTER — Encounter (HOSPITAL_COMMUNITY): Payer: Self-pay | Admitting: Obstetrics and Gynecology

## 2016-06-15 ENCOUNTER — Other Ambulatory Visit: Payer: Self-pay | Admitting: Physician Assistant

## 2016-06-15 DIAGNOSIS — Z1231 Encounter for screening mammogram for malignant neoplasm of breast: Secondary | ICD-10-CM

## 2016-06-26 ENCOUNTER — Ambulatory Visit
Admission: RE | Admit: 2016-06-26 | Discharge: 2016-06-26 | Disposition: A | Discharge status: Home / Self Care | Payer: BC Managed Care – PPO | Source: Ambulatory Visit | Attending: Physician Assistant | Admitting: Physician Assistant

## 2016-06-26 DIAGNOSIS — Z1231 Encounter for screening mammogram for malignant neoplasm of breast: Secondary | ICD-10-CM

## 2016-06-30 ENCOUNTER — Other Ambulatory Visit: Payer: Self-pay | Admitting: Physician Assistant

## 2016-06-30 DIAGNOSIS — R928 Other abnormal and inconclusive findings on diagnostic imaging of breast: Secondary | ICD-10-CM

## 2016-07-06 ENCOUNTER — Other Ambulatory Visit: Payer: Self-pay | Admitting: Physician Assistant

## 2016-07-06 ENCOUNTER — Ambulatory Visit
Admission: RE | Admit: 2016-07-06 | Discharge: 2016-07-06 | Disposition: A | Discharge status: Home / Self Care | Payer: BC Managed Care – PPO | Source: Ambulatory Visit | Attending: Physician Assistant | Admitting: Physician Assistant

## 2016-07-06 DIAGNOSIS — N6320 Unspecified lump in the left breast, unspecified quadrant: Secondary | ICD-10-CM

## 2016-07-06 DIAGNOSIS — R928 Other abnormal and inconclusive findings on diagnostic imaging of breast: Secondary | ICD-10-CM

## 2016-07-09 ENCOUNTER — Ambulatory Visit
Admission: RE | Admit: 2016-07-09 | Discharge: 2016-07-09 | Disposition: A | Discharge status: Home / Self Care | Payer: BC Managed Care – PPO | Source: Ambulatory Visit | Attending: Physician Assistant | Admitting: Physician Assistant

## 2016-07-09 ENCOUNTER — Other Ambulatory Visit: Payer: BC Managed Care – PPO

## 2016-07-09 ENCOUNTER — Other Ambulatory Visit: Payer: Self-pay | Admitting: Physician Assistant

## 2016-07-09 DIAGNOSIS — N6320 Unspecified lump in the left breast, unspecified quadrant: Secondary | ICD-10-CM

## 2016-07-10 ENCOUNTER — Other Ambulatory Visit: Payer: BC Managed Care – PPO

## 2017-02-19 ENCOUNTER — Other Ambulatory Visit: Payer: Self-pay | Admitting: Physician Assistant

## 2017-02-25 LAB — CYTOLOGY - PAP

## 2017-04-28 ENCOUNTER — Other Ambulatory Visit: Payer: Self-pay | Admitting: Physician Assistant

## 2017-04-28 DIAGNOSIS — Z1231 Encounter for screening mammogram for malignant neoplasm of breast: Secondary | ICD-10-CM

## 2017-08-04 ENCOUNTER — Ambulatory Visit: Payer: BC Managed Care – PPO

## 2017-08-04 ENCOUNTER — Other Ambulatory Visit (HOSPITAL_COMMUNITY)
Admission: RE | Admit: 2017-08-04 | Discharge: 2017-08-04 | Disposition: A | Discharge status: Home / Self Care | Payer: BC Managed Care – PPO | Source: Ambulatory Visit | Attending: Family Medicine | Admitting: Family Medicine

## 2017-08-04 ENCOUNTER — Other Ambulatory Visit: Payer: Self-pay | Admitting: Physician Assistant

## 2017-08-04 ENCOUNTER — Ambulatory Visit
Admission: RE | Admit: 2017-08-04 | Discharge: 2017-08-04 | Disposition: A | Discharge status: Home / Self Care | Payer: BC Managed Care – PPO | Source: Ambulatory Visit | Attending: Physician Assistant | Admitting: Physician Assistant

## 2017-08-04 DIAGNOSIS — Z01419 Encounter for gynecological examination (general) (routine) without abnormal findings: Secondary | ICD-10-CM | POA: Insufficient documentation

## 2017-08-04 DIAGNOSIS — Z1231 Encounter for screening mammogram for malignant neoplasm of breast: Secondary | ICD-10-CM

## 2017-08-06 LAB — CYTOLOGY - PAP: Diagnosis: NEGATIVE

## 2017-11-17 ENCOUNTER — Other Ambulatory Visit: Payer: Self-pay | Admitting: Physician Assistant

## 2017-11-17 DIAGNOSIS — Z1231 Encounter for screening mammogram for malignant neoplasm of breast: Secondary | ICD-10-CM

## 2018-03-08 ENCOUNTER — Encounter (HOSPITAL_COMMUNITY): Payer: Self-pay | Admitting: Psychiatry

## 2018-03-08 ENCOUNTER — Ambulatory Visit (INDEPENDENT_AMBULATORY_CARE_PROVIDER_SITE_OTHER): Payer: BC Managed Care – PPO | Admitting: Psychiatry

## 2018-03-08 VITALS — BP 103/72 | HR 80 | Ht 64.0 in | Wt 187.0 lb

## 2018-03-08 DIAGNOSIS — F422 Mixed obsessional thoughts and acts: Secondary | ICD-10-CM

## 2018-03-08 DIAGNOSIS — F331 Major depressive disorder, recurrent, moderate: Secondary | ICD-10-CM

## 2018-03-08 DIAGNOSIS — R5382 Chronic fatigue, unspecified: Secondary | ICD-10-CM

## 2018-03-08 DIAGNOSIS — G43909 Migraine, unspecified, not intractable, without status migrainosus: Secondary | ICD-10-CM

## 2018-03-08 DIAGNOSIS — Z818 Family history of other mental and behavioral disorders: Secondary | ICD-10-CM

## 2018-03-08 DIAGNOSIS — G4719 Other hypersomnia: Secondary | ICD-10-CM | POA: Diagnosis not present

## 2018-03-08 DIAGNOSIS — Z811 Family history of alcohol abuse and dependence: Secondary | ICD-10-CM

## 2018-03-08 DIAGNOSIS — Z72821 Inadequate sleep hygiene: Secondary | ICD-10-CM | POA: Diagnosis not present

## 2018-03-08 DIAGNOSIS — Z79899 Other long term (current) drug therapy: Secondary | ICD-10-CM

## 2018-03-08 MED ORDER — FLUOXETINE HCL 20 MG PO CAPS: 20.0000 mg | ORAL_CAPSULE | Freq: Every day | ORAL | 0 refills | Status: DC

## 2018-03-08 MED ORDER — BUPROPION HCL ER (XL) 300 MG PO TB24: 300.0000 mg | ORAL_TABLET | Freq: Every day | ORAL | 0 refills | Status: DC

## 2018-03-08 NOTE — Patient Instructions (Signed)
Increase wellbutrin to 300 mg XL daily (1 tablet)  STOP Zoloft (which you already have)  START Prozac 20 mg daily  Get the sleep study and mslt

## 2018-03-08 NOTE — Progress Notes (Signed)
Psychiatric Initial Adult Assessment   Patient Identification: Kathryn Rowland MRN:  [id number] Date of Evaluation:  03/08/2018 Referral Source: self Chief Complaint:   fatigue, anxiety Visit Diagnosis:    ICD-10-CM   1. Excessive daytime sleepiness G47.19 Polysomnography 4 or more parameters    Multiple sleep latency test  2. Chronic fatigue R53.82 Polysomnography 4 or more parameters    Multiple sleep latency test  3. Migraine without status migrainosus, not intractable, unspecified migraine type G43.909 Polysomnography 4 or more parameters    Multiple sleep latency test  4. Moderate episode of recurrent major depressive disorder (HCC) F33.1 Polysomnography 4 or more parameters    Multiple sleep latency test    buPROPion (WELLBUTRIN XL) 300 MG 24 hr tablet    FLUoxetine (PROZAC) 20 MG capsule  5. Mixed obsessional thoughts and acts F42.2 Polysomnography 4 or more parameters    Multiple sleep latency test    buPROPion (WELLBUTRIN XL) 300 MG 24 hr tablet    FLUoxetine (PROZAC) 20 MG capsule    History of Present Illness:  Kathryn PartyShantra M Rowland presents for psychiatric assessment.  She has been seeing a psychiatrist in [place] for the past few years where she had been seeing a therapist as well.  She is no longer actively engaged in therapy.  She is currently on Zoloft 100 mg daily and Wellbutrin 150 mg XL.  She has forgotten to take her Zoloft for over a week.  She does feel that the Zoloft helps with her anxiety and rumination, and the Wellbutrin helps with her energy and fatigue.  Her concern about Zoloft is that it seems to make her tired.  We discussed switching from Zoloft to Prozac and agreed to make this change in addition to increasing Wellbutrin to 300 mg.  She reports that she has struggled with chronic fatigue and excessive daytime sleepiness for years, since she was a teenager.  She does have some mild snoring, and wakes up with a headache nearly every day.  She does not tend to  feel rested.  I spent time with her reviewing sleep hygiene issues, and she admits that she continues to sleep with her 565 and 42-year-old sons.  She reports that her and her husband have gotten into a very bad habit with sleeping with her sons, and suspects that this does negatively affect both her and her children's sleep hygiene.  I spent time with her troubleshooting a plan of how she can move forward to change this habit and enlist the support of her husband so that they can commit to discontinuing this sleep pattern.  She reports that she does not use any substances or alcohol on any regular basis.  She denies any acute safety issues or suicidality.  She has no significant medical concerns.  We agreed to follow-up in 4 weeks to see how she is doing with the Prozac and with some of the behavioral changes related to sleep.  Associated Signs/Symptoms: Depression Symptoms:  depressed mood, anhedonia, fatigue, difficulty concentrating, anxiety, disturbed sleep, (Hypo) Manic Symptoms:  none Anxiety Symptoms:  Excessive Worry, Obsessive Compulsive Symptoms:   checklists in mind, Psychotic Symptoms:  none PTSD Symptoms: Negative  Past Psychiatric History: outpatient care, no hospitalization  Previous Psychotropic Medications: Yes  Cymbalta, zoloft  Substance Abuse History in the last 12 months:  No.  Consequences of Substance Abuse: Negative  Past Medical History:  Past Medical History:  Diagnosis Date  . Anxiety   . Depression  Past Surgical History:  Procedure Laterality Date  . CESAREAN SECTION  01/2010   30 wks  . CESAREAN SECTION WITH BILATERAL TUBAL LIGATION  10/17/2012   Procedure: CESAREAN SECTION WITH BILATERAL TUBAL LIGATION;  Surgeon: Oliver Pila, MD;  Location: [facility];  Service: Obstetrics;  Laterality: Bilateral;  Repeat  . cyst removed     right side of neck  . index finger surgery     as an infant - right index  . WISDOM TOOTH EXTRACTION      Family  Psychiatric History: as below  Family History:  Family History  Problem Relation Age of Onset  . Dementia Father   . Alcohol abuse Maternal Grandfather   . Anxiety disorder Paternal Grandfather   . Schizophrenia Cousin     Social History:   Social History   Socioeconomic History  . Marital status: Married    Spouse name: Not on file  . Number of children: Not on file  . Years of education: Not on file  . Highest education level: Not on file  Occupational History  . Not on file  Social Needs  . Financial resource strain: Not hard at all  . Food insecurity:    Worry: Never true    Inability: Never true  . Transportation needs:    Medical: No    Non-medical: No  Tobacco Use  . Smoking status: Never Smoker  . Smokeless tobacco: Never Used  Substance and Sexual Activity  . Alcohol use: No  . Drug use: No  . Sexual activity: Yes    Birth control/protection: None    Comment: pregnant  Lifestyle  . Physical activity:    Days per week: 0 days    Minutes per session: 0 min  . Stress: Not at all  Relationships  . Social connections:    Talks on phone: More than three times a week    Gets together: Once a week    Attends religious service: More than 4 times per year    Active member of club or organization: Yes    Attends meetings of clubs or organizations: More than 4 times per year    Relationship status: Married  Other Topics Concern  . Not on file  Social History Narrative  . Not on file    Additional Social History: works for [employer], lives with husband and 2 sons (ages 5 and 8)  Allergies:  No Known Allergies  Metabolic Disorder Labs: No results found for: HGBA1C, MPG No results found for: PROLACTIN No results found for: CHOL, TRIG, HDL, CHOLHDL, VLDL, LDLCALC   Current Medications: Current Outpatient Medications  Medication Sig Dispense Refill  . buPROPion (WELLBUTRIN XL) 300 MG 24 hr tablet Take 1 tablet (300 mg total) by mouth daily.  90 tablet 0  . Cholecalciferol 1000 UNITS capsule Take 1,000 Units by mouth daily.    Marland Kitchen docusate sodium (COLACE) 100 MG capsule Take 100 mg by mouth daily.    Marland Kitchen FLUoxetine (PROZAC) 20 MG capsule Take 1 capsule (20 mg total) by mouth daily. 90 capsule 0  . ibuprofen (ADVIL,MOTRIN) 600 MG tablet Take 1 tablet (600 mg total) by mouth every 6 (six) hours as needed. (Patient not taking: Reported on 03/08/2018) 30 tablet 1  . oxyCODONE-acetaminophen (PERCOCET/ROXICET) 5-325 MG per tablet Take 1-2 tablets by mouth every 4 (four) hours as needed (moderate - severe pain). (Patient not taking: Reported on 03/08/2018) 30 tablet 0  . Prenatal Vit-Fe Fumarate-FA (PRENATAL MULTIVITAMIN) TABS  Take 1 tablet by mouth daily.     No current facility-administered medications for this visit.     Neurologic: Headache: Yes Seizure: Negative Paresthesias:Negative  Musculoskeletal: Strength & Muscle Tone: within normal limits Gait & Station: normal Patient leans: N/A  Psychiatric Specialty Exam: Review of Systems  Constitutional: Positive for malaise/fatigue.  HENT: Negative.   Respiratory: Negative.   Cardiovascular: Negative.   Gastrointestinal: Negative.   Musculoskeletal: Negative.   Neurological: Positive for headaches.  Psychiatric/Behavioral: The patient is nervous/anxious and has insomnia.     Blood pressure 103/72, pulse 80, height 5\' 4"  (1.626 m), weight 187 lb (84.8 kg), SpO2 100 %, unknown if currently breastfeeding.Body mass index is 32.1 kg/m.  General Appearance: Casual and Well Groomed  Eye Contact:  Fair  Speech:  Clear and Coherent and Normal Rate  Volume:  Normal  Mood:  Anxious and tired/fatigued  Affect:  Non-Congruent and does not appear tired or fatigues  Thought Process:  Coherent and Descriptions of Associations: Intact  Orientation:  Full (Time, Place, and Person)  Thought Content:  Logical  Suicidal Thoughts:  No  Homicidal Thoughts:  No  Memory:  Immediate;   Fair   Judgement:  Fair  Insight:  Fair  Psychomotor Activity:  Normal  Concentration:  Concentration: Good  Recall:  Good  Fund of Knowledge:Good  Language: Good  Akathisia:  Negative  Handed:  Right  AIMS (if indicated):  na  Assets:  Communication Skills Desire for Improvement Financial Resources/Insurance Housing Intimacy Leisure Time Physical Health Resilience Social Support Talents/Skills Transportation Vocational/Educational  ADL's:  Intact  Cognition: WNL  Sleep:  Complicated by sleep setting, poor    Treatment Plan Summary: Kathryn Rowland presents for psychiatric intake assessment.  She has a pattern of anxiety and obsessive thinking concerning for OCD.  She reports a childhood history of OCD and chronic fatigue, and has been on psychiatric medication management for many years.  She does have some fatigue with Zoloft, and may benefit from a switch to Prozac.  We also agreed to further titrate Wellbutrin to 300 mg XL.  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   1. Excessive daytime sleepiness   2. Chronic fatigue   3. Migraine without status migrainosus, not intractable, unspecified migraine type   4. Moderate episode of recurrent major depressive disorder (HCC)   5. Mixed obsessional thoughts and acts     Status of current problems: new to [employer] Ordered: Orders Placed This Encounter  Procedures  . Polysomnography 4 or more parameters    Standing Status:   Future    Standing Expiration Date:   03/09/2019    Order Specific Question:   Where should this test be performed:    Answer:   [facility]  . Multiple sleep latency test    Standing Status:   Future    Standing Expiration Date:   03/09/2019    Order Specific Question:   Where should this test be performed:    Answer:   [facility]    Labs  Reviewed: obtain laboratory studies at follow-up in 8 weeks  Collateral Obtained/Records Reviewed: n/a  Plan:  Patient self-discontinue zoloft about 1.5 weeks ago - forgot to take it Start prozac 20 mg daily Wellbutrin 300 mg XL daily Rtc 8 weeks polysom and mslt    I spent  45 minutes with the patient in direct face-to-face clinical care.  Greater than 50% of this time was spent in counseling and coordination of care with the patient.   Burnard Leigh, MD 4/23/201911:53 AM

## 2018-04-04 ENCOUNTER — Telehealth (HOSPITAL_COMMUNITY): Payer: Self-pay

## 2018-04-04 NOTE — Telephone Encounter (Signed)
Patient called the [facility] and because they bill outpatient her insurance will not cover. Patient wants to know if there is somewhere else she can get it done that would charge her as in office so that insurance will cover at 100%

## 2018-04-05 NOTE — Telephone Encounter (Signed)
She should coordinate with her pcp to see if they have another network they can refer her to.  I dont know of any besides ours and [facility] in [place]

## 2018-04-07 NOTE — Telephone Encounter (Signed)
Called the patient and let her know what Dr. Rene Kocher said and patient was in agreement with this plan.

## 2018-05-03 ENCOUNTER — Ambulatory Visit (HOSPITAL_COMMUNITY): Payer: BC Managed Care – PPO | Admitting: Psychiatry

## 2018-05-03 ENCOUNTER — Encounter (HOSPITAL_COMMUNITY): Payer: Self-pay | Admitting: Psychiatry

## 2018-05-03 DIAGNOSIS — F331 Major depressive disorder, recurrent, moderate: Secondary | ICD-10-CM | POA: Diagnosis not present

## 2018-05-03 DIAGNOSIS — F422 Mixed obsessional thoughts and acts: Secondary | ICD-10-CM

## 2018-05-03 MED ORDER — FLUOXETINE HCL 40 MG PO CAPS
40.0000 mg | ORAL_CAPSULE | Freq: Every day | ORAL | 0 refills | Status: DC
Start: 2018-05-03 — End: 2018-12-15

## 2018-05-03 MED ORDER — BUPROPION HCL ER (XL) 300 MG PO TB24
300.0000 mg | ORAL_TABLET | Freq: Every day | ORAL | 0 refills | Status: AC
Start: 2018-05-03 — End: ?

## 2018-05-03 NOTE — Progress Notes (Signed)
BH MD/PA/NP OP Progress Note  05/03/2018 12:15 PM Kathryn Rowland  MRN:  [id number]  Chief Complaint: Doing better HPI: Patient reports that she is doing much better with the Prozac and Wellbutrin combination and has recently learned that she has low vitamin D.  She is still working on getting the sleep study scheduled and is getting help from her primary care provider to get a referral to an ambulatory sleep clinic so that it is less costly for her insurance.  She still has some ongoing anxiety symptoms and we agreed to increase Prozac to 40 mg, and we will follow-up in 3 months.  She has not had any significant side effects from Prozac and feels more comfortable with this medication.  No acute safety concerns or substance abuse.  She and her husband have made significant progress in being able to navigate the change in sleeping, such that her children are sleeping in their own beds rather than with mom and dad.  Visit Diagnosis:    ICD-10-CM   1. Moderate episode of recurrent major depressive disorder (HCC) F33.1 FLUoxetine (PROZAC) 40 MG capsule    buPROPion (WELLBUTRIN XL) 300 MG 24 hr tablet  2. Mixed obsessional thoughts and acts F42.2 FLUoxetine (PROZAC) 40 MG capsule    buPROPion (WELLBUTRIN XL) 300 MG 24 hr tablet    Past Psychiatric History: See intake H&P for full details. Reviewed, with no updates at this time.  Past Medical History:  Past Medical History:  Diagnosis Date  . Anxiety   . Depression     Past Surgical History:  Procedure Laterality Date  . CESAREAN SECTION  01/2010   30 wks  . CESAREAN SECTION WITH BILATERAL TUBAL LIGATION  10/17/2012   Procedure: CESAREAN SECTION WITH BILATERAL TUBAL LIGATION;  Surgeon: Oliver Pila, MD;  Location: [facility];  Service: Obstetrics;  Laterality: Bilateral;  Repeat  . cyst removed     right side of neck  . index finger surgery     as an infant - right index  . WISDOM TOOTH EXTRACTION      Family Psychiatric History: See  intake H&P for full details. Reviewed, with no updates at this time.   Family History:  Family History  Problem Relation Age of Onset  . Dementia Father   . Alcohol abuse Maternal Grandfather   . Anxiety disorder Paternal Grandfather   . Schizophrenia Cousin     Social History:  Social History   Socioeconomic History  . Marital status: Married    Spouse name: Not on file  . Number of children: Not on file  . Years of education: Not on file  . Highest education level: Not on file  Occupational History  . Not on file  Social Needs  . Financial resource strain: Not hard at all  . Food insecurity:    Worry: Never true    Inability: Never true  . Transportation needs:    Medical: No    Non-medical: No  Tobacco Use  . Smoking status: Never Smoker  . Smokeless tobacco: Never Used  Substance and Sexual Activity  . Alcohol use: No  . Drug use: No  . Sexual activity: Yes    Birth control/protection: None    Comment: pregnant  Lifestyle  . Physical activity:    Days per week: 0 days    Minutes per session: 0 min  . Stress: Not at all  Relationships  . Social connections:    Talks on phone: More than  three times a week    Gets together: Once a week    Attends religious service: More than 4 times per year    Active member of club or organization: Yes    Attends meetings of clubs or organizations: More than 4 times per year    Relationship status: Married  Other Topics Concern  . Not on file  Social History Narrative  . Not on file    Allergies: No Known Allergies  Metabolic Disorder Labs: No results found for: HGBA1C, MPG No results found for: PROLACTIN No results found for: CHOL, TRIG, HDL, CHOLHDL, VLDL, LDLCALC No results found for: TSH  Therapeutic Level Labs: No results found for: LITHIUM No results found for: VALPROATE No components found for:  CBMZ  Current Medications: Current Outpatient Medications  Medication Sig Dispense Refill  . buPROPion  (WELLBUTRIN XL) 300 MG 24 hr tablet Take 1 tablet (300 mg total) by mouth daily. 90 tablet 0  . Cholecalciferol 1000 UNITS capsule Take 1,000 Units by mouth daily.    Marland Kitchen. docusate sodium (COLACE) 100 MG capsule Take 100 mg by mouth daily.    Marland Kitchen. FLUoxetine (PROZAC) 40 MG capsule Take 1 capsule (40 mg total) by mouth daily. 90 capsule 0  . ibuprofen (ADVIL,MOTRIN) 600 MG tablet Take 1 tablet (600 mg total) by mouth every 6 (six) hours as needed. (Patient not taking: Reported on 03/08/2018) 30 tablet 1  . oxyCODONE-acetaminophen (PERCOCET/ROXICET) 5-325 MG per tablet Take 1-2 tablets by mouth every 4 (four) hours as needed (moderate - severe pain). (Patient not taking: Reported on 03/08/2018) 30 tablet 0  . Prenatal Vit-Fe Fumarate-FA (PRENATAL MULTIVITAMIN) TABS Take 1 tablet by mouth daily.     No current facility-administered medications for this visit.      Musculoskeletal: Strength & Muscle Tone: within normal limits Gait & Station: normal Patient leans: N/A  Psychiatric Specialty Exam: ROS  Blood pressure 122/70, pulse 74, height 5\' 4"  (1.626 m), weight 183 lb 12.8 oz (83.4 kg), unknown if currently breastfeeding.Body mass index is 31.55 kg/m.  General Appearance: Casual and Well Groomed  Eye Contact:  Good  Speech:  Clear and Coherent and Normal Rate  Volume:  Normal  Mood:  Euthymic  Affect:  Appropriate and Congruent  Thought Process:  Goal Directed and Descriptions of Associations: Intact  Orientation:  Full (Time, Place, and Person)  Thought Content: Logical   Suicidal Thoughts:  No  Homicidal Thoughts:  No  Memory:  Immediate;   Good  Judgement:  Good  Insight:  Good  Psychomotor Activity:  Normal  Concentration:  Concentration: Good  Recall:  Good  Fund of Knowledge: Good  Language: Good  Akathisia:  Negative  Handed:  Right  AIMS (if indicated): not done  Assets:  Communication Skills Desire for Improvement Financial Resources/Insurance Housing Intimacy Leisure  Time Physical Health Resilience Social Support Talents/Skills Transportation Vocational/Educational  ADL's:  Intact  Cognition: WNL  Sleep:  Good   Screenings:   Assessment and Plan:  Kathryn Rowland presents with improving mood on the fluoxetine and Wellbutrin combination, good tolerability.  We agreed to increase Prozac to 40 mg for a maintenance dose and we will follow-up in 3 months.  She is working with her primary care doctor on getting the sleep study done at an ambulatory clinic such that the insurance co-pay costs are reduced.  She also notes that her primary care diagnosed her with hypovitaminosis D and she has been prescribed ergocalciferol which she will start  this week.   No acute safety issues and we will follow-up in 3 months.  Disclosed to patient that this Clinical research associate is leaving this practice at the end of August 2019, and patients always has the right to choose their provider. Reassured patient that office will work to provide smooth transition of care whether they wish to remain at this office, or to continue with this provider, or seek alternative care options in community.  They expressed understanding.   1. Moderate episode of recurrent major depressive disorder (HCC)   2. Mixed obsessional thoughts and acts     Status of current problems: gradually improving  Labs Ordered: No orders of the defined types were placed in this encounter.   Labs Reviewed: n/a  Collateral Obtained/Records Reviewed: n/a  Plan:  Increase Prozac to 40 mg Continue Wellbutrin 300 mg extended release Follow-up in 3 months  Burnard Leigh, MD 05/03/2018, 12:15 PM

## 2018-08-10 ENCOUNTER — Ambulatory Visit: Payer: BC Managed Care – PPO

## 2018-08-24 ENCOUNTER — Ambulatory Visit: Payer: BC Managed Care – PPO

## 2018-08-24 ENCOUNTER — Institutional Professional Consult (permissible substitution): Payer: Self-pay | Admitting: Neurology

## 2018-09-06 ENCOUNTER — Ambulatory Visit: Payer: BC Managed Care – PPO

## 2018-09-21 ENCOUNTER — Ambulatory Visit: Payer: BC Managed Care – PPO

## 2018-09-27 ENCOUNTER — Ambulatory Visit: Payer: BC Managed Care – PPO

## 2018-10-12 ENCOUNTER — Ambulatory Visit: Payer: BC Managed Care – PPO

## 2018-12-05 ENCOUNTER — Ambulatory Visit
Admission: RE | Admit: 2018-12-05 | Discharge: 2018-12-05 | Disposition: A | Discharge status: Home / Self Care | Payer: BC Managed Care – PPO | Source: Ambulatory Visit | Attending: Physician Assistant | Admitting: Physician Assistant

## 2018-12-05 DIAGNOSIS — Z1231 Encounter for screening mammogram for malignant neoplasm of breast: Secondary | ICD-10-CM

## 2018-12-14 ENCOUNTER — Encounter: Payer: Self-pay | Admitting: Neurology

## 2018-12-15 ENCOUNTER — Ambulatory Visit: Payer: BC Managed Care – PPO | Admitting: Neurology

## 2018-12-15 ENCOUNTER — Encounter: Payer: Self-pay | Admitting: Neurology

## 2018-12-15 VITALS — BP 114/79 | HR 90 | Ht 64.0 in | Wt 190.0 lb

## 2018-12-15 DIAGNOSIS — F909 Attention-deficit hyperactivity disorder, unspecified type: Secondary | ICD-10-CM

## 2018-12-15 DIAGNOSIS — G4711 Idiopathic hypersomnia with long sleep time: Secondary | ICD-10-CM | POA: Insufficient documentation

## 2018-12-15 DIAGNOSIS — G2581 Restless legs syndrome: Secondary | ICD-10-CM

## 2018-12-15 DIAGNOSIS — G4719 Other hypersomnia: Secondary | ICD-10-CM | POA: Insufficient documentation

## 2018-12-15 NOTE — Addendum Note (Signed)
Addended by: Melvyn Novas on: 12/15/2018 01:57 PM   Modules accepted: Orders

## 2018-12-15 NOTE — Patient Instructions (Signed)

## 2018-12-15 NOTE — Progress Notes (Signed)
SLEEP MEDICINE CLINIC   Provider:  Larey Seat, MD   Primary Care Physician:  Lennie Odor, PA-C   Referring Provider: Lennie Odor, PA-C   Chief Complaint  Patient presents with  . New Patient (Initial Visit)    pt alone, rm 10. pt states about 6 yrs ago she was recommended to get a sleep study but patient was pregnant and unable to complete at that time. they had recommended that she had a sleep study/MSLT ordered. pt states that she gets 10 hrs sleep. she wakes up and feels like she never sleeps. when she is asleep she doesnt feel like it restful. never had a sleep study. pt denies that falling asleep during the day but states that she could if she laid down. doesnt snore in sleep.     HPI:  Kathryn Rowland is a 43 y.o. female patient, seen here as in a referral from [facility] / from [place], [place] for sleep testing.   I had the pleasure of meeting Kathryn Rowland been great today who had a very extensive work-up with her primary care through the [facility] sleep services.  She was originally referred by Fawn Kirk at the recommendation of Dr. Chauncey Cruel cure per psychiatry provider.    Chief complaint according to patient : She has had excessive daytime sleepiness since the seventh grade of school also there is a waxing and waning degree over the years.  She is prone to feel sleepy when driving, she will fall asleep when not physically active and not mentally stimulated but never has fallen asleep in the car or in a conversation. Her children now sleep through the night and her own sleep lasts between 9 and 10 hours.   Sleep habits are as follows: dinner time 5.30- 6 Pm and bedtime 8 Pm, no trouble to fall asleep, bedroom is dark, cool and quiet. Preferred sleep position is on her side and on one "my pillow" , a contoured pillow.  1-2 nocturia. After 4 AM she will be dreaming so vividly, she feels her dreams are so activating, she feels as if not asleep. She has not enacted dreams.      Rises at 6.20 AM, but alarm is set to 5.30- she is very tired , not refreshed nor restored. Her naps are longer ones, 3-4 hours in the afternoon, not necessary feeling refreshed. Power naps don't work for her either. No snoring , some nights having PLMs or RLS symptoms.    Sleep medical history: Longstanding excessive daytime sleepiness since her youth,  negatively impacting her quality of life and her professional life as well as her marriage.   She is by no means sleep deprived, but hypersomnia is not improved by more sleep to be added.  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  She was already cautioned by her primary care provider and in the sleep clinic against drowsy driving.  A sleep study was already requested mainly a PSG overnight study was an M SLT to follow for which she has to wean off fluoxetine, the generic form of Prozac.  Family sleep history: first child born prematurely - kids are now 54 and 34 . Both on Vyvanse. MGF was a sleep talker, laughing and yelling in his sleep- he never left the bed and went to sleep again- REM BD .  Both parents snore, and are very, very sleepy. 85 year younger brother  fell asleep driving. Father was a third shift Insurance underwriter.   Social history: non smoker, non ETOH. , caffeine-  Coffee, sodas, tea- with no effect.  No third shift work. She tried one night of night shift - couldn't do it.  Lives with husband and children on a private home   Review of Systems: Out of a complete 14 system review, the patient complains of only the following symptoms, and all other reviewed systems are negative.  Vivid dreams , hypersomnia with prolonged sleep time, family history of sleepiness. No sleep paralysis, but rare RLS symptoms, not snoring.   Epworth Sleeping score: 16/ 24 points  , Fatigue severity score 52/ 63   , depression score: N/ A she is treated for depression, anxiety since 2010. Marland Kitchen     Social History   Socioeconomic History  . Marital status: Married    Spouse name: Not on file  . Number of children: Not on file  . Years of education: Not on file  . Highest education level: Not on file  Occupational History  . Not on file  Social Needs  . Financial resource strain: Not hard at all  . Food insecurity:    Worry: Never true    Inability: Never true  . Transportation needs:    Medical: No    Non-medical: No  Tobacco Use  . Smoking status: Never Smoker  . Smokeless tobacco: Never Used  Substance and Sexual Activity  . Alcohol use: No  . Drug use: No  . Sexual activity: Yes    Birth control/protection: None    Comment: pregnant  Lifestyle  . Physical activity:    Days per week: 0 days    Minutes per session: 0 min  . Stress: Not at all  Relationships  . Social connections:    Talks on phone: More than three times a week    Gets together: Once a week    Attends religious service: More than 4 times per year    Active member of club or organization: Yes    Attends meetings of clubs or organizations: More than 4 times per year    Relationship status: Married  . Intimate partner violence:    Fear of current or ex partner: No    Emotionally abused: No    Physically abused: No    Forced sexual activity: No  Other Topics Concern  . Not on file  Social History Narrative  . Not on file    Family History  Problem Relation Age of Onset  . Dementia Father   . Alcohol abuse Maternal Grandfather   . Anxiety disorder Paternal Grandfather   . Schizophrenia Cousin   . Hypothyroidism Mother     Past Medical History:  Diagnosis Date  . Anxiety   . Depression     Past Surgical History:  Procedure Laterality Date  . CESAREAN SECTION  01/2010   30 wks  . CESAREAN SECTION WITH BILATERAL TUBAL LIGATION  10/17/2012   Procedure: CESAREAN SECTION WITH BILATERAL TUBAL LIGATION;  Surgeon: Logan Bores, MD;  Location: [facility];  Service: Obstetrics;   Laterality: Bilateral;  Repeat  . cyst removed     right side of neck  . index finger surgery     as an infant - right index  . WISDOM TOOTH EXTRACTION      Current Outpatient Medications  Medication Sig Dispense Refill  . buPROPion (WELLBUTRIN XL) 300 MG 24 hr tablet Take 1 tablet (300 mg total)  by mouth daily. 90 tablet 0  . Cholecalciferol (VITAMIN D3 ULTRA POTENCY) 1.25 MG (50000 UT) TABS Take 1 tablet by mouth once a week.    . sertraline (ZOLOFT) 100 MG tablet Take 100 mg by mouth daily.     No current facility-administered medications for this visit.     Allergies as of 12/15/2018  . (No Known Allergies)    Vitals: BP 114/79   Pulse 90   Ht _0  (1.626 m)   Wt 190 lb (86.2 kg)   BMI 32.61 kg/m  Last Weight:  Wt Readings from Last 1 Encounters:  12/15/18 190 lb (86.2 kg)   XEN:MMHW mass index is 32.61 kg/m.     Last Height:   Ht Readings from Last 1 Encounters:  12/15/18 _1  (1.626 m)    Physical exam:  General: The patient is awake, alert and appears not in acute distress. The patient is well groomed. Head: Normocephalic, atraumatic. Neck is supple. Mallampati: 1- wide open.   neck circumference: 14 . Nasal airflow patent ,  Retrognathia is not seen.  Cardiovascular:  Regular rate and rhythm, without  murmurs or carotid bruit, and without distended neck veins. Respiratory: Lungs are clear to auscultation. Skin:  Without evidence of edema, or rash Trunk: BMI is 32.6. The patient's posture is erect.    Neurologic exam : The patient is awake and alert, oriented to place and time.     Attention span & concentration ability appears normal.  Speech is fluent,  without dysarthria, dysphonia or aphasia.  Mood and affect are appropriate.  Cranial nerves: Pupils are equal and briskly reactive to light.  Funduscopic exam without evidence of pallor or edema. Extraocular movements  in vertical and horizontal planes intact and without nystagmus. Visual fields by  finger perimetry are intact. Hearing to finger rub intact.  Facial sensation intact to fine touch. Facial motor strength is symmetric and tongue and uvula move midline. Shoulder shrug was symmetrical.   Motor exam: Normal tone, muscle bulk and symmetric strength in all extremities.  Sensory:  Fine touch, pinprick and vibration were normal-  Proprioception tested in the upper extremities was normal. Coordination: Rapid alternating movements in the fingers/hands was normal. Finger-to-nose maneuver  normal without evidence of ataxia, dysmetria or tremor. Gait and station: Patient walks without assistive device, turns with 3 Steps. Deep tendon reflexes: in the upper and lower extremities are symmetric and intact.   Assessment:  After physical and neurologic examination, review of laboratory studies,  Personal review of imaging studies, reports of other /same  Imaging studies, results of polysomnography and / or neurophysiology testing and pre-existing records as far as provided in visit., my assessment is:    1) Mrs. 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  She does not recall having cataplectic attacks.  She is usually able to control her sleepiness somewhat and has therefore not fall asleep when driving or when in a professional conference.  What is fascinating is that she has a strong family history of hypersomnia in her brother and both of her parents although her parents may have other comorbidities and social situations that have contributed to the sleep needs.  My goal is to evaluate her  now in a PSG with an M SLT to follow, given that she is prepared to wean off SSRi  and  Wellbutrin. I will order an HLA  based narcolepsy genetic panel.   The patient was advised of the nature of the diagnosed disorder , the treatment options and the  risks for general health and wellness arising from not treating the condition.   I spent more than 45  minutes of face to face time with the patient.  Greater than 50% of time was spent in counseling and coordination of care. We have discussed the diagnosis and differential and I answered the patient's questions.    Plan:  Treatment plan and additional workup :  PSG with REM montage, followed by MSLT, first nap after 120 minutes -  Weaning off meds is needed: all REM suppressant medication are to be off for at least 3 weeks.    Larey Seat, MD 01/25/8117, 8:67 PM  Certified in Neurology by ABPN Certified in [place] by [facility] [address], [place], [place] [address]

## 2018-12-24 LAB — TSH+FREE T4
FREE T4: 1.19 ng/dL (ref 0.82–1.77)
TSH: 1.54 u[IU]/mL (ref 0.450–4.500)

## 2018-12-24 LAB — IRON,TIBC AND FERRITIN PANEL
FERRITIN: 57 ng/mL (ref 15–150)
IRON: 77 ug/dL (ref 27–159)
Iron Saturation: 26 % (ref 15–55)
Total Iron Binding Capacity: 297 ug/dL (ref 250–450)
UIBC: 220 ug/dL (ref 131–425)

## 2018-12-24 LAB — NARCOLEPSY EVALUATION
DQA1*01:02: NEGATIVE
DQB1*06:02: NEGATIVE

## 2018-12-26 ENCOUNTER — Telehealth: Payer: Self-pay | Admitting: Neurology

## 2018-12-26 NOTE — Telephone Encounter (Signed)
Called the patient to inform her of these labs. No answer. LVM informing the pt to call back  **if pt calls back please advise the lab work that looks for narcolepsy gene came back negative. This doesn't mean she doesn't have narcolepsy only that she doesn't carry the gene. The pt is scheduled 2/24 and 2/25 for the sleep test which will look to confirm whether she does or doesn't have narcolepsy.

## 2018-12-26 NOTE — Telephone Encounter (Signed)
-----  Message from Larey Seat, MD sent at 12/26/2018  9:01 AM EST ----- Both alleles are negative for HLA associated with narcolepsy .  CC Redmon, PA

## 2018-12-26 NOTE — Telephone Encounter (Signed)
Pt returned RN's call. She will not be off her meds for 3 weeks by her sleep appt on 2/24. RN advised pt needs to speak with Megan/sleep to r/s her appt. Pt said she will call back tomorrow to r/s.  FYI

## 2018-12-27 NOTE — Telephone Encounter (Signed)
Pt was called yesterday about rescheduling NPSG and MSLT. Pt doesn't know timeline of weaning of her meds. Pt to call back today or tmrw to reschedule

## 2019-04-12 ENCOUNTER — Other Ambulatory Visit: Payer: Self-pay | Admitting: Physician Assistant

## 2019-04-12 DIAGNOSIS — Z1231 Encounter for screening mammogram for malignant neoplasm of breast: Secondary | ICD-10-CM

## 2019-04-18 ENCOUNTER — Telehealth: Payer: Self-pay

## 2019-04-18 NOTE — Telephone Encounter (Signed)
Pt has been called on 5/4, 5/19, and on 04/18/2019 to reschedule her NPSG and MSLT. The 6/2 phone call, pt stated she needed to check her schedule and will call us back at the end of the month.

## 2019-12-08 ENCOUNTER — Ambulatory Visit: Payer: BC Managed Care – PPO

## 2019-12-12 ENCOUNTER — Ambulatory Visit: Payer: BC Managed Care – PPO

## 2020-01-29 ENCOUNTER — Other Ambulatory Visit (HOSPITAL_COMMUNITY)
Admission: RE | Admit: 2020-01-29 | Discharge: 2020-01-29 | Disposition: A | Discharge status: Home / Self Care | Payer: BC Managed Care – PPO | Source: Ambulatory Visit | Attending: Physician Assistant | Admitting: Physician Assistant

## 2020-01-29 ENCOUNTER — Ambulatory Visit: Payer: BC Managed Care – PPO

## 2020-01-29 ENCOUNTER — Other Ambulatory Visit: Payer: Self-pay | Admitting: Physician Assistant

## 2020-01-29 DIAGNOSIS — Z124 Encounter for screening for malignant neoplasm of cervix: Secondary | ICD-10-CM | POA: Insufficient documentation

## 2020-01-31 LAB — CYTOLOGY - PAP: Diagnosis: NEGATIVE

## 2020-02-14 ENCOUNTER — Other Ambulatory Visit: Payer: Self-pay

## 2020-02-14 ENCOUNTER — Ambulatory Visit: Payer: BC Managed Care – PPO

## 2020-02-14 ENCOUNTER — Ambulatory Visit
Admission: RE | Admit: 2020-02-14 | Discharge: 2020-02-14 | Disposition: A | Discharge status: Home / Self Care | Payer: BC Managed Care – PPO | Source: Ambulatory Visit | Attending: Physician Assistant | Admitting: Physician Assistant

## 2020-02-14 DIAGNOSIS — Z1231 Encounter for screening mammogram for malignant neoplasm of breast: Secondary | ICD-10-CM

## 2020-05-02 IMAGING — MG DIGITAL SCREENING BILATERAL MAMMOGRAM WITH TOMO AND CAD
8 series · 9 of 24 positions shown · non-contrast
Comparison: Previous exam(s).

CLINICAL DATA: Screening.

EXAM:
DIGITAL SCREENING BILATERAL MAMMOGRAM WITH TOMO AND CAD

[R MLO synth-2D]
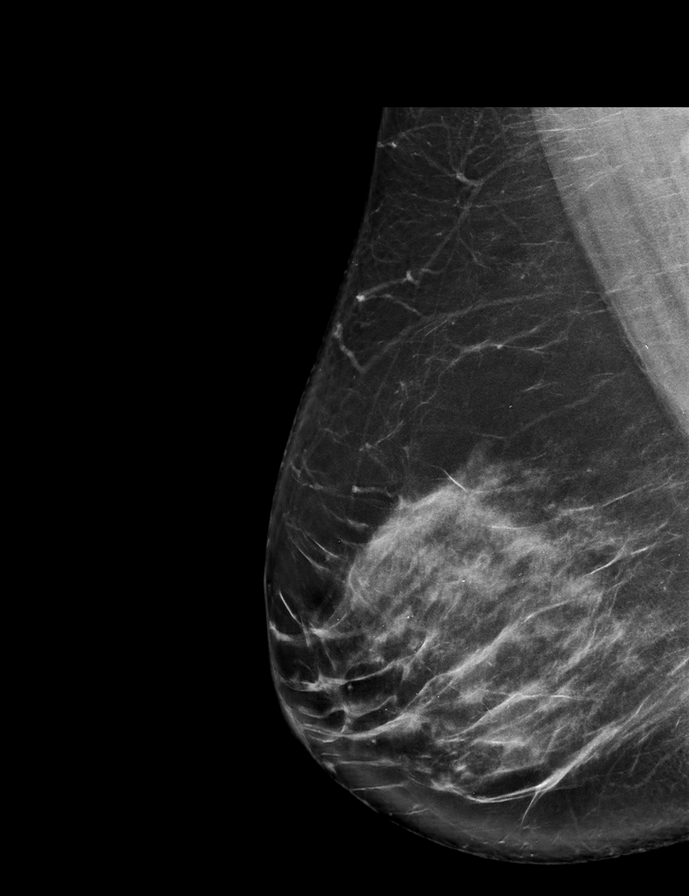

[L CC synth-2D]
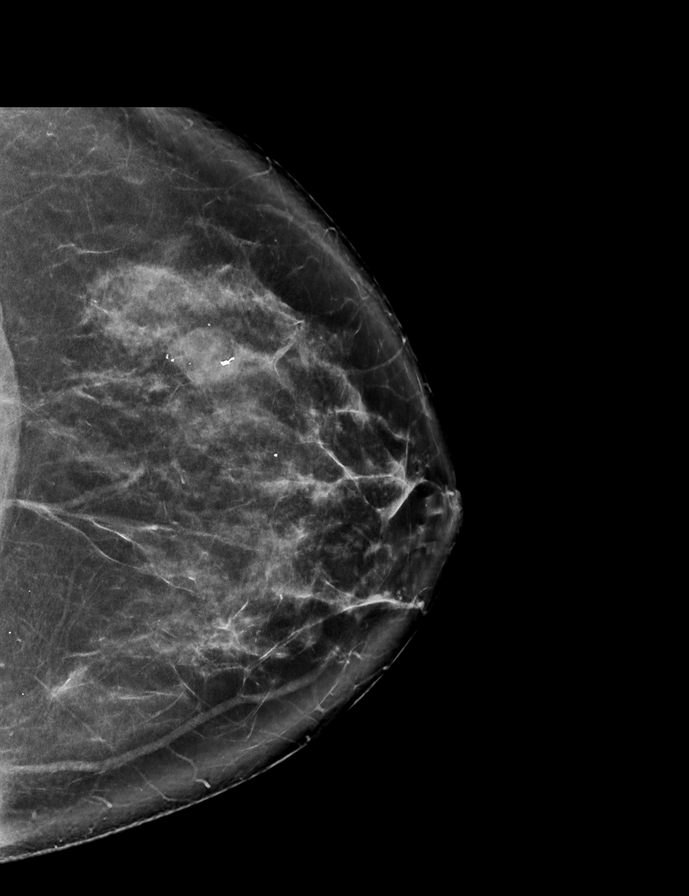

[R CC synth-2D]
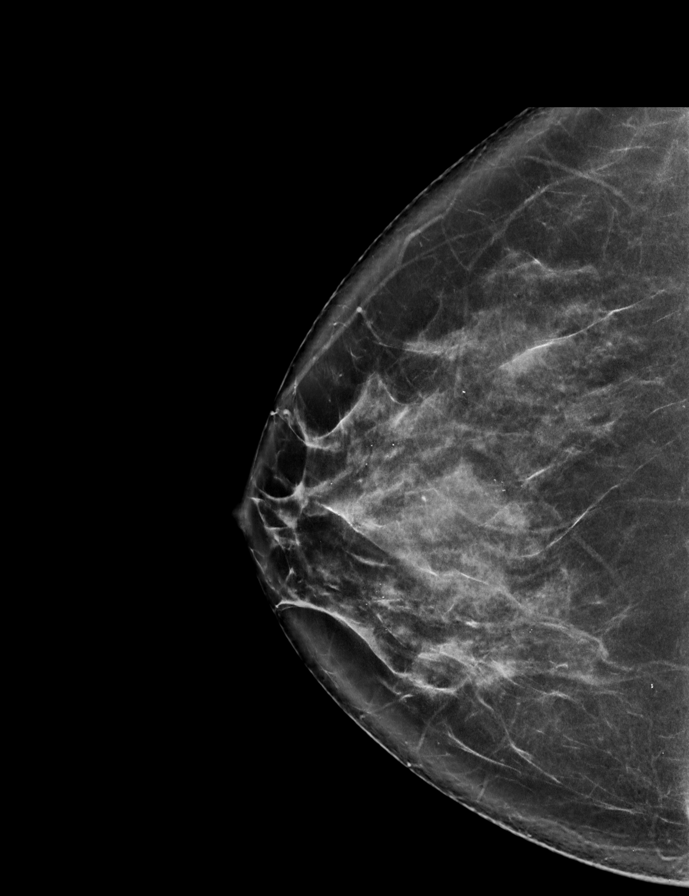

[L MLO synth-2D]
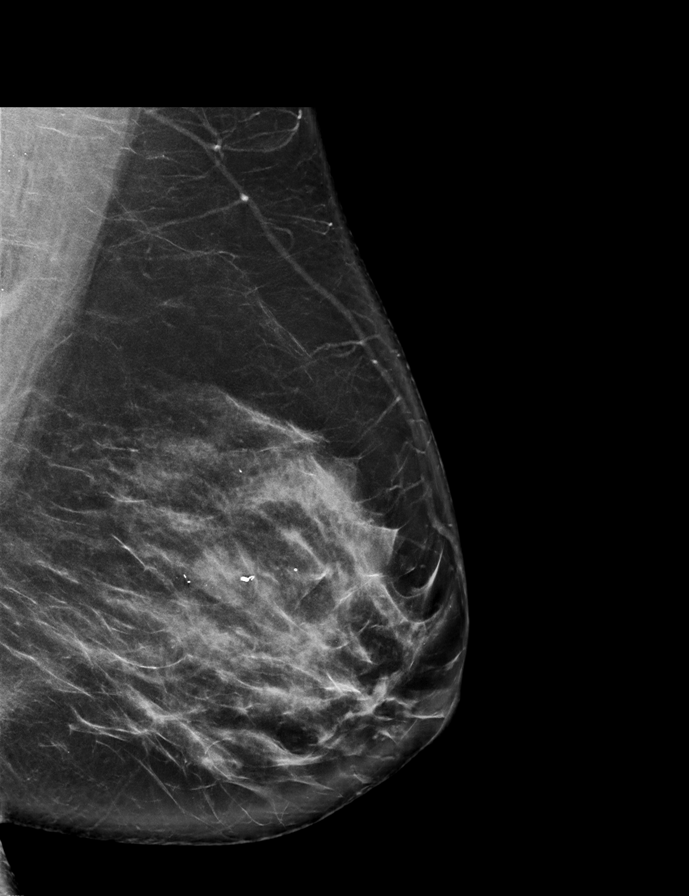

[L CC tomo · 2 of 88 frames shown]
[frame 29/88]
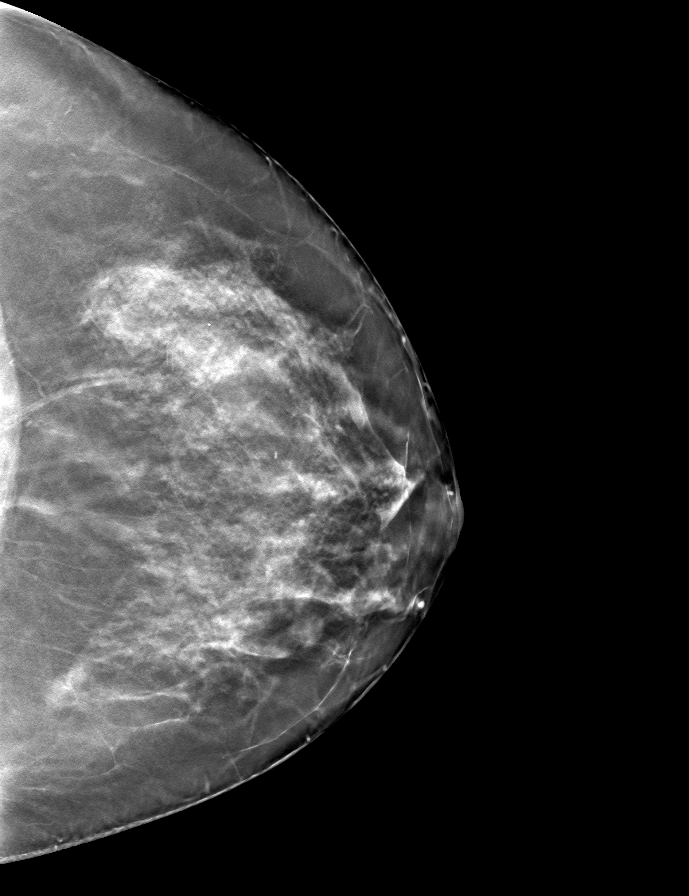
[frame 45/88]
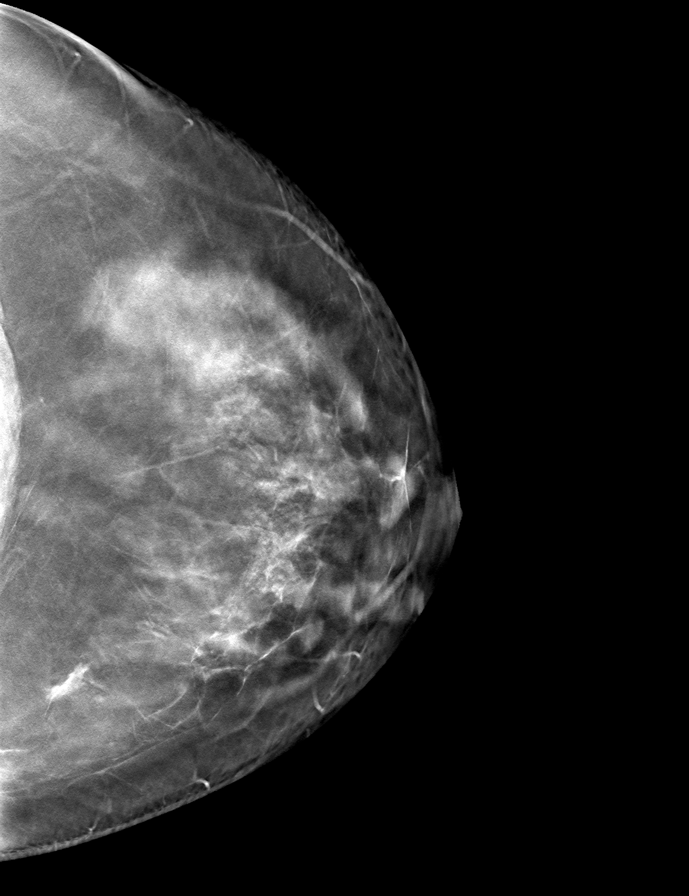

[R CC tomo · tomo slice 41/82.0]
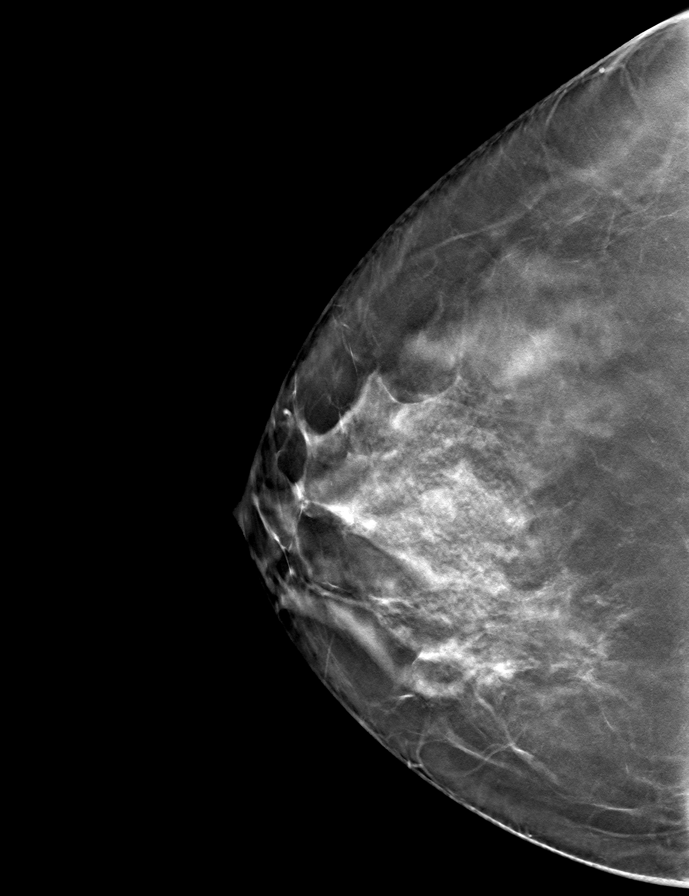

[L MLO tomo · tomo slice 45/88.0]
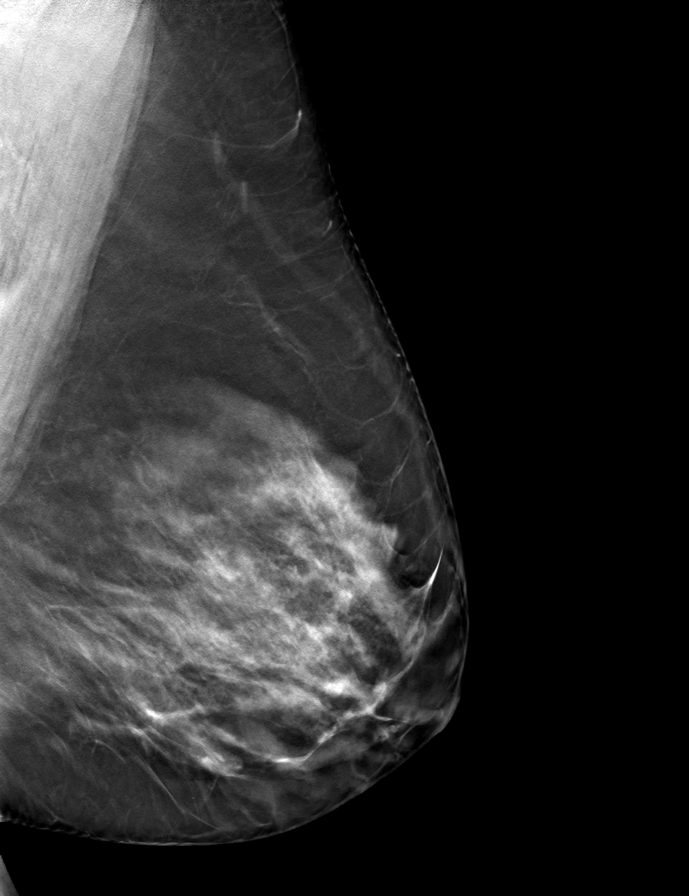

[R MLO tomo · tomo slice 46/91.0]
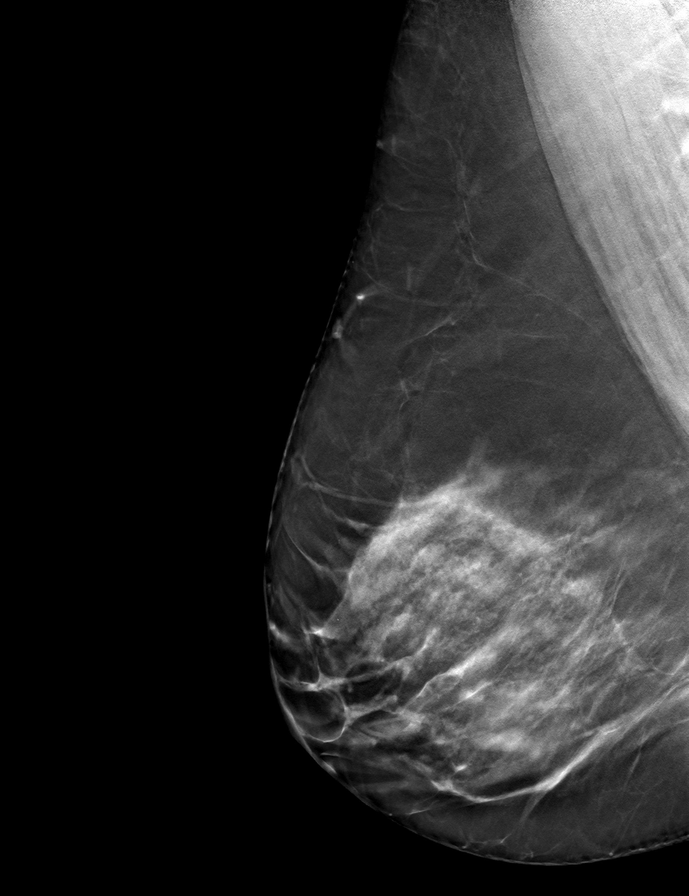

[9 of 24 positions shown; findings below may reference images not displayed]

ACR Breast Density Category c: The breast tissue is heterogeneously
dense, which may obscure small masses.
FINDINGS: There are no findings suspicious for malignancy. Images were
processed with CAD.
IMPRESSION: No mammographic evidence of malignancy. A result letter of this
screening mammogram will be mailed directly to the patient.

RECOMMENDATION:
Screening mammogram in one year. (Code:[id number])

BI-RADS CATEGORY  1: Negative.

## 2020-08-20 ENCOUNTER — Telehealth: Payer: Self-pay | Admitting: Neurology

## 2020-08-20 NOTE — Telephone Encounter (Signed)
I do not do a lot of cognitive neurology so would prefer not to see.

## 2020-08-20 NOTE — Telephone Encounter (Signed)
Patient has been referred back to [place] for cognitive and memory issues with a family history of dementia. She has seen Dr. Vickey Huger in the past for sleep, but based on Dr. Unice Bailey recommendation would like to see Dr. Epimenio Foot for this. Would you both be ok with this?

## 2020-08-20 NOTE — Telephone Encounter (Signed)
I am fine with a switch to Dr. Epimenio Foot for a new problem. CD

## 2021-01-22 ENCOUNTER — Other Ambulatory Visit: Payer: Self-pay | Admitting: Physician Assistant

## 2021-01-22 DIAGNOSIS — Z1231 Encounter for screening mammogram for malignant neoplasm of breast: Secondary | ICD-10-CM

## 2021-04-22 ENCOUNTER — Ambulatory Visit: Payer: BC Managed Care – PPO

## 2021-04-26 ENCOUNTER — Ambulatory Visit
Admission: RE | Admit: 2021-04-26 | Discharge: 2021-04-26 | Disposition: A | Discharge status: Home / Self Care | Payer: Self-pay | Source: Ambulatory Visit | Attending: Physician Assistant | Admitting: Physician Assistant

## 2021-04-26 DIAGNOSIS — Z1231 Encounter for screening mammogram for malignant neoplasm of breast: Secondary | ICD-10-CM

## 2021-07-25 ENCOUNTER — Other Ambulatory Visit (HOSPITAL_BASED_OUTPATIENT_CLINIC_OR_DEPARTMENT_OTHER): Payer: Self-pay

## 2021-07-25 DIAGNOSIS — G4710 Hypersomnia, unspecified: Secondary | ICD-10-CM

## 2021-10-03 ENCOUNTER — Other Ambulatory Visit: Payer: Self-pay | Admitting: Physician Assistant

## 2021-10-03 DIAGNOSIS — Z1231 Encounter for screening mammogram for malignant neoplasm of breast: Secondary | ICD-10-CM

## 2021-11-30 ENCOUNTER — Encounter (HOSPITAL_BASED_OUTPATIENT_CLINIC_OR_DEPARTMENT_OTHER): Payer: BC Managed Care – PPO | Admitting: Internal Medicine

## 2021-12-01 ENCOUNTER — Encounter (HOSPITAL_BASED_OUTPATIENT_CLINIC_OR_DEPARTMENT_OTHER): Payer: BC Managed Care – PPO | Admitting: Internal Medicine

## 2022-04-29 ENCOUNTER — Ambulatory Visit
Admission: RE | Admit: 2022-04-29 | Discharge: 2022-04-29 | Disposition: A | Discharge status: Home / Self Care | Payer: Self-pay | Source: Ambulatory Visit | Attending: Physician Assistant | Admitting: Physician Assistant

## 2022-04-29 DIAGNOSIS — Z1231 Encounter for screening mammogram for malignant neoplasm of breast: Secondary | ICD-10-CM

## 2023-04-27 ENCOUNTER — Other Ambulatory Visit: Payer: Self-pay | Admitting: Physician Assistant

## 2023-04-27 DIAGNOSIS — Z1231 Encounter for screening mammogram for malignant neoplasm of breast: Secondary | ICD-10-CM

## 2023-05-18 ENCOUNTER — Ambulatory Visit: Payer: Self-pay

## 2023-07-01 ENCOUNTER — Ambulatory Visit: Payer: Self-pay

## 2023-07-08 ENCOUNTER — Ambulatory Visit
Admission: RE | Admit: 2023-07-08 | Discharge: 2023-07-08 | Disposition: A | Discharge status: Home / Self Care | Payer: BC Managed Care – PPO | Source: Ambulatory Visit | Attending: Physician Assistant | Admitting: Physician Assistant

## 2023-07-08 DIAGNOSIS — Z1231 Encounter for screening mammogram for malignant neoplasm of breast: Secondary | ICD-10-CM

## 2024-05-18 ENCOUNTER — Other Ambulatory Visit: Payer: Self-pay | Admitting: Physician Assistant

## 2024-05-18 DIAGNOSIS — Z1231 Encounter for screening mammogram for malignant neoplasm of breast: Secondary | ICD-10-CM

## 2024-07-24 ENCOUNTER — Ambulatory Visit
Admission: RE | Admit: 2024-07-24 | Discharge: 2024-07-24 | Disposition: A | Discharge status: Home / Self Care | Payer: Self-pay | Source: Ambulatory Visit | Attending: Physician Assistant | Admitting: Physician Assistant

## 2024-07-24 DIAGNOSIS — Z1231 Encounter for screening mammogram for malignant neoplasm of breast: Secondary | ICD-10-CM

## 2024-07-28 ENCOUNTER — Other Ambulatory Visit: Payer: Self-pay | Admitting: Physician Assistant

## 2024-07-28 DIAGNOSIS — R928 Other abnormal and inconclusive findings on diagnostic imaging of breast: Secondary | ICD-10-CM

## 2024-08-02 ENCOUNTER — Ambulatory Visit
Admission: RE | Admit: 2024-08-02 | Discharge: 2024-08-02 | Disposition: A | Discharge status: Home / Self Care | Source: Ambulatory Visit | Attending: Physician Assistant | Admitting: Physician Assistant

## 2024-08-02 ENCOUNTER — Other Ambulatory Visit

## 2024-08-02 ENCOUNTER — Other Ambulatory Visit: Payer: Self-pay | Admitting: Physician Assistant

## 2024-08-02 DIAGNOSIS — N6312 Unspecified lump in the right breast, upper inner quadrant: Secondary | ICD-10-CM

## 2024-08-02 DIAGNOSIS — R928 Other abnormal and inconclusive findings on diagnostic imaging of breast: Secondary | ICD-10-CM

## 2024-08-03 ENCOUNTER — Ambulatory Visit
Admission: RE | Admit: 2024-08-03 | Discharge: 2024-08-03 | Disposition: A | Discharge status: Home / Self Care | Source: Ambulatory Visit | Attending: Physician Assistant | Admitting: Physician Assistant

## 2024-08-03 ENCOUNTER — Other Ambulatory Visit: Payer: Self-pay | Admitting: Physician Assistant

## 2024-08-03 DIAGNOSIS — N6312 Unspecified lump in the right breast, upper inner quadrant: Secondary | ICD-10-CM

## 2024-08-03 HISTORY — PX: BREAST BIOPSY: SHX20

## 2024-08-04 LAB — SURGICAL PATHOLOGY
